# Patient Record
Sex: Female | Born: 1935 | Race: White | Hispanic: No | Marital: Single
Health system: Southern US, Community
[De-identification: ages and names within clinical notes are randomized; demographics above are authoritative.]

---

## 2015-02-23 DIAGNOSIS — Y93A9 Activity, other involving cardiorespiratory exercise: Secondary | ICD-10-CM

## 2015-02-23 NOTE — Patient Instructions (Signed)
Attended chair yoga class.

## 2015-02-23 NOTE — Congregational Nurse Program (Signed)
Congregational Nurse Program Note  Date of Encounter: 02/23/2015  Past Medical History: No past medical history on file.  Encounter Details:     CNP Questionnaire - 02/23/15 1601    Patient Demographics   Is this a new or existing patient? New   Patient is considered a/an Not Applicable   Race Caucasian/White   Patient Assistance   Location of Patient Assistance Not Applicable   Patient's financial/insurance status Medicare   Uninsured Patient No   Patient referred to apply for the following financial assistance Not Applicable   Food insecurities addressed Not Applicable   Transportation assistance No   Assistance securing medications No   Educational health offerings Exercise/physical activity   Encounter Details   Primary purpose of visit Education/Health Concerns   Was an Emergency Department visit averted? Not Applicable   Does patient have a medical provider? Yes   Patient referred to Not Applicable   Was a mental health screening completed? (GAINS tool) No   Does patient have dental issues? No   Since previous encounter, have you referred patient for abnormal blood pressure that resulted in a new diagnosis or medication change? No   Since previous encounter, have you referred patient for abnormal blood glucose that resulted in a new diagnosis or medication change? No   For Abstraction Use Only   Does patient have insurance? Yes

## 2015-04-22 DIAGNOSIS — Y9342 Activity, yoga: Secondary | ICD-10-CM

## 2015-04-22 NOTE — Congregational Nurse Program (Signed)
Congregational Nurse Program Note  Date of Encounter: 04/22/2015  Past Medical History: No past medical history on file.  Encounter Details:     CNP Questionnaire - 04/22/15 2204    For Abstraction Use Only   Does patient have insurance? Yes       Attended Chair Yoga Class.

## 2015-05-25 DIAGNOSIS — Y9342 Activity, yoga: Secondary | ICD-10-CM

## 2015-05-25 NOTE — Congregational Nurse Program (Unsigned)
Congregational Nurse Program Note  Date of Encounter: 05/25/2015  Past Medical History: No past medical history on file.  Encounter Details:     CNP Questionnaire - 05/25/15 0959    Patient Demographics   Is this a new or existing patient? Existing   Patient is considered a/an Not Applicable   Race Caucasian/White   Patient Assistance   Location of Patient Assistance Not Applicable   Patient's financial/insurance status Medicare   Uninsured Patient No   Patient referred to apply for the following financial assistance Not Applicable   Food insecurities addressed Not Applicable   Transportation assistance No   Assistance securing medications No   Educational health offerings Exercise/physical activity   Encounter Details   Primary purpose of visit Education/Health Concerns   Was an Emergency Department visit averted? Not Applicable   Does patient have a medical provider? Yes   Patient referred to Not Applicable   Was a mental health screening completed? (GAINS tool) No   Does patient have dental issues? No   Does patient have vision issues? No   Since previous encounter, have you referred patient for abnormal blood pressure that resulted in a new diagnosis or medication change? No   Since previous encounter, have you referred patient for abnormal blood glucose that resulted in a new diagnosis or medication change? No   For Abstraction Use Only   Does patient have insurance? Yes       Attended chair yoga class.

## 2015-06-23 DIAGNOSIS — H40013 Open angle with borderline findings, low risk, bilateral: Secondary | ICD-10-CM | POA: Diagnosis not present

## 2015-06-23 DIAGNOSIS — Q103 Other congenital malformations of eyelid: Secondary | ICD-10-CM | POA: Diagnosis not present

## 2015-06-23 DIAGNOSIS — H25013 Cortical age-related cataract, bilateral: Secondary | ICD-10-CM | POA: Diagnosis not present

## 2015-06-23 DIAGNOSIS — H2513 Age-related nuclear cataract, bilateral: Secondary | ICD-10-CM | POA: Diagnosis not present

## 2015-07-03 DIAGNOSIS — D1801 Hemangioma of skin and subcutaneous tissue: Secondary | ICD-10-CM | POA: Diagnosis not present

## 2015-07-03 DIAGNOSIS — L814 Other melanin hyperpigmentation: Secondary | ICD-10-CM | POA: Diagnosis not present

## 2015-07-03 DIAGNOSIS — D235 Other benign neoplasm of skin of trunk: Secondary | ICD-10-CM | POA: Diagnosis not present

## 2015-07-03 DIAGNOSIS — L570 Actinic keratosis: Secondary | ICD-10-CM | POA: Diagnosis not present

## 2015-07-03 DIAGNOSIS — L821 Other seborrheic keratosis: Secondary | ICD-10-CM | POA: Diagnosis not present

## 2015-07-28 DIAGNOSIS — Y9342 Activity, yoga: Secondary | ICD-10-CM

## 2015-07-28 NOTE — Congregational Nurse Program (Signed)
Congregational Nurse Program Note  Date of Encounter: 07/28/2015  Past Medical History: No past medical history on file.  Encounter Details:     CNP Questionnaire - 07/28/15 1433    Patient Demographics   Is this a new or existing patient? Existing   Patient is considered a/an Not Applicable   Race Caucasian/White   Patient Assistance   Location of Patient Assistance Not Applicable   Patient's financial/insurance status Medicare   Uninsured Patient No   Patient referred to apply for the following financial assistance Not Applicable   Food insecurities addressed Not Applicable   Transportation assistance No   Assistance securing medications No   Educational health offerings Exercise/physical activity   Encounter Details   Primary purpose of visit Education/Health Concerns   Was an Emergency Department visit averted? Not Applicable   Does patient have a medical provider? Yes   Patient referred to Not Applicable   Was a mental health screening completed? (GAINS tool) No   Does patient have dental issues? No   Does patient have vision issues? No   Does your patient have an abnormal blood pressure today? No   Since previous encounter, have you referred patient for abnormal blood pressure that resulted in a new diagnosis or medication change? No   Does your patient have an abnormal blood glucose today? No   Since previous encounter, have you referred patient for abnormal blood glucose that resulted in a new diagnosis or medication change? No   Was there a life-saving intervention made? No       Attended chair yoga class.

## 2015-10-01 DIAGNOSIS — Y9342 Activity, yoga: Secondary | ICD-10-CM

## 2015-10-01 NOTE — Congregational Nurse Program (Signed)
Congregational Nurse Program Note  Date of Encounter: 10/01/2015  Past Medical History: No past medical history on file.  Encounter Details:     CNP Questionnaire - 09/16/15 1047      Patient Demographics   Is this a new or existing patient? Existing   Patient is considered a/an Not Applicable   Race Caucasian/White     Patient Assistance   Location of Patient Assistance Not Applicable   Patient's financial/insurance status Medicare   Uninsured Patient No   Patient referred to apply for the following financial assistance Not Applicable   Food insecurities addressed Not Applicable   Transportation assistance No   Assistance securing medications No   Educational health offerings Exercise/physical activity     Encounter Details   Primary purpose of visit Education/Health Concerns   Was an Emergency Department visit averted? Not Applicable   Does patient have a medical provider? Yes   Patient referred to Not Applicable   Was a mental health screening completed? (GAINS tool) No   Does patient have dental issues? No   Does patient have vision issues? No   Does your patient have an abnormal blood pressure today? No   Since previous encounter, have you referred patient for abnormal blood pressure that resulted in a new diagnosis or medication change? No   Does your patient have an abnormal blood glucose today? No   Since previous encounter, have you referred patient for abnormal blood glucose that resulted in a new diagnosis or medication change? No   Was there a life-saving intervention made? No      Attended chair yoga class.

## 2015-10-02 DIAGNOSIS — Z0000 Encounter for general adult medical examination without abnormal findings: Secondary | ICD-10-CM | POA: Diagnosis not present

## 2015-10-02 DIAGNOSIS — Z131 Encounter for screening for diabetes mellitus: Secondary | ICD-10-CM | POA: Diagnosis not present

## 2015-11-12 DIAGNOSIS — Y9342 Activity, yoga: Secondary | ICD-10-CM

## 2015-11-12 NOTE — Congregational Nurse Program (Signed)
Attended chair yoga class.Congregational Nurse Program Note  Date of Encounter: 11/12/2015  Past Medical History: No past medical history on file.  Encounter Details:     CNP Questionnaire - 11/12/15 1837      Patient Demographics   Is this a new or existing patient? Existing   Patient is considered a/an Not Applicable   Race Caucasian/White     Patient Assistance   Location of Patient Assistance Not Applicable   Patient's financial/insurance status Medicare   Uninsured Patient No   Patient referred to apply for the following financial assistance Not Applicable   Food insecurities addressed Not Applicable   Transportation assistance No   Assistance securing medications No   Educational health offerings Exercise/physical activity     Encounter Details   Primary purpose of visit Education/Health Concerns   Was an Emergency Department visit averted? Not Applicable   Does patient have a medical provider? Yes   Patient referred to Not Applicable   Was a mental health screening completed? (GAINS tool) No   Does patient have dental issues? No   Does patient have vision issues? No   Does your patient have an abnormal blood pressure today? No   Since previous encounter, have you referred patient for abnormal blood pressure that resulted in a new diagnosis or medication change? No   Does your patient have an abnormal blood glucose today? No   Since previous encounter, have you referred patient for abnormal blood glucose that resulted in a new diagnosis or medication change? No   Was there a life-saving intervention made? No

## 2015-11-24 DIAGNOSIS — Y9342 Activity, yoga: Secondary | ICD-10-CM

## 2015-11-24 NOTE — Congregational Nurse Program (Signed)
Congregational Nurse Program Note  Date of Encounter: 11/24/2015  Past Medical History: No past medical history on file.  Encounter Details:     CNP Questionnaire - 11/24/15 1118      Patient Demographics   Is this a new or existing patient? Existing   Patient is considered a/an Not Applicable   Race Caucasian/White     Patient Assistance   Location of Patient Assistance Not Applicable   Patient's financial/insurance status Medicare   Uninsured Patient No   Patient referred to apply for the following financial assistance Not Applicable   Food insecurities addressed Not Applicable   Transportation assistance No   Assistance securing medications No   Educational health offerings Exercise/physical activity     Encounter Details   Primary purpose of visit Education/Health Concerns   Was an Emergency Department visit averted? Not Applicable   Does patient have a medical provider? Yes   Patient referred to Not Applicable   Was a mental health screening completed? (GAINS tool) No   Does patient have dental issues? No   Does patient have vision issues? No   Does your patient have an abnormal blood pressure today? No   Since previous encounter, have you referred patient for abnormal blood pressure that resulted in a new diagnosis or medication change? No   Does your patient have an abnormal blood glucose today? No   Since previous encounter, have you referred patient for abnormal blood glucose that resulted in a new diagnosis or medication change? No   Was there a life-saving intervention made? No    Attended chair yoga class.

## 2016-01-02 NOTE — Congregational Nurse Program (Signed)
Congregational Nurse Program Note  Date of Encounter: 01/02/2016  Past Medical History: No past medical history on file.  Encounter Details:     CNP Questionnaire - 01/02/16 1312      Patient Demographics   Is this a new or existing patient? Existing   Patient is considered a/an Not Applicable   Race Caucasian/White     Patient Assistance   Location of Patient Assistance Not Applicable   Patient's financial/insurance status Medicare   Uninsured Patient ([facility]) No   Patient referred to apply for the following financial assistance Not Applicable   Food insecurities addressed Not Applicable   Transportation assistance No   Assistance securing medications No   Educational health offerings Exercise/physical activity     Encounter Details   Primary purpose of visit Education/Health Concerns   Was an Emergency Department visit averted? Not Applicable   Does patient have a medical provider? Yes   Patient referred to Not Applicable   Was a mental health screening completed? (GAINS tool) No   Does patient have dental issues? No   Does patient have vision issues? No   Does your patient have an abnormal blood pressure today? No   Since previous encounter, have you referred patient for abnormal blood pressure that resulted in a new diagnosis or medication change? No   Does your patient have an abnormal blood glucose today? No   Since previous encounter, have you referred patient for abnormal blood glucose that resulted in a new diagnosis or medication change? No   Was there a life-saving intervention made? No     Attended chair yoga class.

## 2016-01-20 NOTE — Congregational Nurse Program (Signed)
Congregational Nurse Program Note  Date of Encounter: 01/20/2016  Past Medical History: No past medical history on file.  Encounter Details:     CNP Questionnaire - 01/20/16 1511      Patient Demographics   Is this a new or existing patient? Existing   Patient is considered a/an Not Applicable   Race Caucasian/White     Patient Assistance   Location of Patient Assistance Not Applicable   Patient's financial/insurance status Medicare   Uninsured Patient ([facility]) No   Patient referred to apply for the following financial assistance Not Applicable   Food insecurities addressed Not Applicable   Transportation assistance No   Assistance securing medications No   Educational health offerings Exercise/physical activity     Encounter Details   Primary purpose of visit Education/Health Concerns   Was an Emergency Department visit averted? Not Applicable   Does patient have a medical provider? Yes   Patient referred to Not Applicable   Was a mental health screening completed? (GAINS tool) No   Does patient have dental issues? No   Does patient have vision issues? No   Does your patient have an abnormal blood pressure today? No   Since previous encounter, have you referred patient for abnormal blood pressure that resulted in a new diagnosis or medication change? No   Does your patient have an abnormal blood glucose today? No   Since previous encounter, have you referred patient for abnormal blood glucose that resulted in a new diagnosis or medication change? No   Was there a life-saving intervention made? No     Attended chair yoga class.

## 2016-04-14 NOTE — Congregational Nurse Program (Signed)
Congregational Nurse Program Note  Date of Encounter: 04/14/2016  Past Medical History: No past medical history on file.  Encounter Details:     CNP Questionnaire - 04/14/16 1133      Patient Demographics   Is this a new or existing patient? Existing   Patient is considered a/an Not Applicable   Race Caucasian/White     Patient Assistance   Location of Patient Assistance Not Applicable   Patient's financial/insurance status Medicare   Uninsured Patient ([facility]) No   Patient referred to apply for the following financial assistance Not Applicable   Food insecurities addressed Not Applicable   Transportation assistance No   Assistance securing medications No   Educational health offerings Exercise/physical activity     Encounter Details   Primary purpose of visit Education/Health Concerns   Was an Emergency Department visit averted? Not Applicable   Does patient have a medical provider? Yes   Patient referred to Not Applicable   Was a mental health screening completed? (GAINS tool) No   Does patient have dental issues? No   Does patient have vision issues? No   Does your patient have an abnormal blood pressure today? No   Since previous encounter, have you referred patient for abnormal blood pressure that resulted in a new diagnosis or medication change? No   Does your patient have an abnormal blood glucose today? No   Since previous encounter, have you referred patient for abnormal blood glucose that resulted in a new diagnosis or medication change? No   Was there a life-saving intervention made? No    Attended chair yoga class.

## 2016-05-09 NOTE — Congregational Nurse Program (Signed)
Congregational Nurse Program Note  Date of Encounter: 05/09/2016  Past Medical History: No past medical history on file.  Encounter Details:     CNP Questionnaire - 05/09/16 1757      Patient Demographics   Is this a new or existing patient? Existing   Patient is considered a/an Not Applicable   Race Caucasian/White     Patient Assistance   Location of Patient Assistance Not Applicable   Patient's financial/insurance status Medicare   Uninsured Patient ([facility]) No   Patient referred to apply for the following financial assistance Not Applicable   Food insecurities addressed Not Applicable   Transportation assistance No   Assistance securing medications No   Educational health offerings Exercise/physical activity     Encounter Details   Primary purpose of visit Education/Health Concerns   Was an Emergency Department visit averted? Not Applicable   Does patient have a medical provider? Yes   Patient referred to Not Applicable   Was a mental health screening completed? (GAINS tool) No   Does patient have dental issues? No   Does patient have vision issues? No   Does your patient have an abnormal blood pressure today? No   Since previous encounter, have you referred patient for abnormal blood pressure that resulted in a new diagnosis or medication change? No   Does your patient have an abnormal blood glucose today? No   Since previous encounter, have you referred patient for abnormal blood glucose that resulted in a new diagnosis or medication change? No   Was there a life-saving intervention made? No     attended chair yoga class

## 2016-05-21 LAB — POCT LIPID PANEL
HDL: 54
LDL: 96
TC: 173
TRG: 114

## 2016-05-21 LAB — GLUCOSE, POCT (MANUAL RESULT ENTRY): POC Glucose: 89 mg/dl (ref 70–99)

## 2016-05-24 ENCOUNTER — Other Ambulatory Visit: Payer: Self-pay | Admitting: Family Medicine

## 2016-05-24 DIAGNOSIS — Z1231 Encounter for screening mammogram for malignant neoplasm of breast: Secondary | ICD-10-CM

## 2016-06-30 DIAGNOSIS — H25013 Cortical age-related cataract, bilateral: Secondary | ICD-10-CM | POA: Diagnosis not present

## 2016-06-30 DIAGNOSIS — H43813 Vitreous degeneration, bilateral: Secondary | ICD-10-CM | POA: Diagnosis not present

## 2016-06-30 DIAGNOSIS — H40013 Open angle with borderline findings, low risk, bilateral: Secondary | ICD-10-CM | POA: Diagnosis not present

## 2016-06-30 DIAGNOSIS — H2513 Age-related nuclear cataract, bilateral: Secondary | ICD-10-CM | POA: Diagnosis not present

## 2016-07-01 DIAGNOSIS — D225 Melanocytic nevi of trunk: Secondary | ICD-10-CM | POA: Diagnosis not present

## 2016-07-01 DIAGNOSIS — D1801 Hemangioma of skin and subcutaneous tissue: Secondary | ICD-10-CM | POA: Diagnosis not present

## 2016-07-01 DIAGNOSIS — L820 Inflamed seborrheic keratosis: Secondary | ICD-10-CM | POA: Diagnosis not present

## 2016-07-01 DIAGNOSIS — L814 Other melanin hyperpigmentation: Secondary | ICD-10-CM | POA: Diagnosis not present

## 2016-07-01 DIAGNOSIS — L570 Actinic keratosis: Secondary | ICD-10-CM | POA: Diagnosis not present

## 2016-07-14 ENCOUNTER — Telehealth: Payer: Self-pay | Admitting: Family Medicine

## 2016-07-14 NOTE — Telephone Encounter (Signed)
Patient calling to speak with you regarding the letter that was written for her regarding her weight loss please call her at [phone]

## 2016-07-19 ENCOUNTER — Ambulatory Visit
Admission: RE | Admit: 2016-07-19 | Discharge: 2016-07-19 | Disposition: A | Discharge status: Home / Self Care | Payer: PPO | Source: Ambulatory Visit | Attending: Family Medicine | Admitting: Family Medicine

## 2016-07-19 DIAGNOSIS — Z1231 Encounter for screening mammogram for malignant neoplasm of breast: Secondary | ICD-10-CM | POA: Diagnosis not present

## 2016-07-19 NOTE — Congregational Nurse Program (Signed)
Congregational Nurse Program Note  Date of Encounter: 07/19/2016  Past Medical History: No past medical history on file.  Encounter Details:    Attended chair yoga class.

## 2016-07-26 ENCOUNTER — Other Ambulatory Visit: Payer: Self-pay | Admitting: Family Medicine

## 2016-07-26 DIAGNOSIS — R928 Other abnormal and inconclusive findings on diagnostic imaging of breast: Secondary | ICD-10-CM

## 2016-08-02 ENCOUNTER — Other Ambulatory Visit: Payer: Self-pay | Admitting: Family Medicine

## 2016-08-02 ENCOUNTER — Ambulatory Visit
Admission: RE | Admit: 2016-08-02 | Discharge: 2016-08-02 | Disposition: A | Discharge status: Home / Self Care | Payer: PPO | Source: Ambulatory Visit | Attending: Family Medicine | Admitting: Family Medicine

## 2016-08-02 DIAGNOSIS — R928 Other abnormal and inconclusive findings on diagnostic imaging of breast: Secondary | ICD-10-CM

## 2016-08-02 DIAGNOSIS — N6310 Unspecified lump in the right breast, unspecified quadrant: Secondary | ICD-10-CM

## 2016-08-02 DIAGNOSIS — N6489 Other specified disorders of breast: Secondary | ICD-10-CM | POA: Diagnosis not present

## 2016-08-12 NOTE — Congregational Nurse Program (Signed)
Congregational Nurse Program Note  Date of Encounter: 08/12/2016  Past Medical History: No past medical history on file.  Encounter Details:     CNP Questionnaire - 08/12/16 1409      Patient Demographics   Is this a new or existing patient? Existing   Patient is considered a/an Not Applicable   Race Caucasian/White     Patient Assistance   Location of Patient Assistance Not Applicable   Patient's financial/insurance status Medicare   Uninsured Patient ([facility]) No   Patient referred to apply for the following financial assistance Not Applicable   Food insecurities addressed Not Applicable   Transportation assistance No   Assistance securing medications No   Educational health offerings Exercise/physical activity     Encounter Details   Primary purpose of visit Education/Health Concerns   Was an Emergency Department visit averted? Not Applicable   Does patient have a medical provider? Yes   Patient referred to Not Applicable   Was a mental health screening completed? (GAINS tool) No   Does patient have dental issues? No   Does patient have vision issues? No   Does your patient have an abnormal blood pressure today? No   Since previous encounter, have you referred patient for abnormal blood pressure that resulted in a new diagnosis or medication change? No   Does your patient have an abnormal blood glucose today? No   Since previous encounter, have you referred patient for abnormal blood glucose that resulted in a new diagnosis or medication change? No   Was there a life-saving intervention made? No    Attended chair yoga class.

## 2016-10-18 NOTE — Congregational Nurse Program (Signed)
Congregational Nurse Program Note  Date of Encounter: 10/18/2016  Past Medical History: No past medical history on file.  Encounter Details:     CNP Questionnaire - 10/18/16 1537      Patient Demographics   Is this a new or existing patient? Existing   Patient is considered a/an Not Applicable   Race Caucasian/White     Patient Assistance   Location of Patient Assistance Not Applicable   Patient's financial/insurance status Medicare   Uninsured Patient ([facility]) No   Patient referred to apply for the following financial assistance Not Applicable   Food insecurities addressed Not Applicable   Transportation assistance No   Assistance securing medications No   Educational health offerings Exercise/physical activity     Encounter Details   Primary purpose of visit Education/Health Concerns   Was an Emergency Department visit averted? Not Applicable   Does patient have a medical provider? Yes   Patient referred to Not Applicable   Was a mental health screening completed? (GAINS tool) No   Does patient have dental issues? No   Does patient have vision issues? No   Does your patient have an abnormal blood pressure today? No   Since previous encounter, have you referred patient for abnormal blood pressure that resulted in a new diagnosis or medication change? No   Does your patient have an abnormal blood glucose today? No   Since previous encounter, have you referred patient for abnormal blood glucose that resulted in a new diagnosis or medication change? No   Was there a life-saving intervention made? No    Attended chair yoga class.

## 2016-10-26 DIAGNOSIS — Z131 Encounter for screening for diabetes mellitus: Secondary | ICD-10-CM | POA: Diagnosis not present

## 2016-10-26 DIAGNOSIS — Z0000 Encounter for general adult medical examination without abnormal findings: Secondary | ICD-10-CM | POA: Diagnosis not present

## 2016-10-26 DIAGNOSIS — Z1389 Encounter for screening for other disorder: Secondary | ICD-10-CM | POA: Diagnosis not present

## 2016-10-26 DIAGNOSIS — N6009 Solitary cyst of unspecified breast: Secondary | ICD-10-CM | POA: Diagnosis not present

## 2016-10-26 DIAGNOSIS — E7800 Pure hypercholesterolemia, unspecified: Secondary | ICD-10-CM | POA: Diagnosis not present

## 2016-11-10 NOTE — Congregational Nurse Program (Signed)
Congregational Nurse Program Note  Date of Encounter: 11/10/2016  Past Medical History: No past medical history on file.  Encounter Details:

## 2016-11-10 NOTE — Congregational Nurse Program (Signed)
Congregational Nurse Program Note  Date of Encounter: 11/10/2016  Past Medical History: No past medical history on file.  Encounter Details: Attended chair yoga class.

## 2016-12-20 NOTE — Congregational Nurse Program (Signed)
Congregational Nurse Program Note  Date of Encounter: 12/20/2016  Past Medical History: No past medical history on file.  Encounter Details: CNP Questionnaire - 12/20/16 1452      Questionnaire   Patient Status  Not Applicable    Race  White or Caucasian    Location Patient Served At  Not Applicable    Insurance  Medicare    Uninsured  Not Applicable    Food  No food insecurities    Housing/Utilities  Yes, have permanent housing    Transportation  No transportation needs    Interpersonal Safety  Yes, feel physically and emotionally safe where you currently live    Medication  No medication insecurities    Medical Provider  Yes    Referrals  Not Applicable    ED Visit Averted  Not Applicable    Life-Saving Intervention Made  Not Applicable       Madelaine Etienne, [place], [phone].

## 2017-01-13 NOTE — Congregational Nurse Program (Signed)
Congregational Nurse Program Note  Date of Encounter: 01/13/2017  Past Medical History: No past medical history on file.  Encounter Details: CNP Questionnaire - 01/13/17 2251      Questionnaire   Patient Status  Not Applicable    Race  White or Caucasian    Location Patient Served At  Not Applicable    Insurance  Medicare    Uninsured  Not Applicable    Food  No food insecurities    Housing/Utilities  Yes, have permanent housing    Transportation  No transportation needs    Interpersonal Safety  Yes, feel physically and emotionally safe where you currently live    Medication  No medication insecurities    Medical Provider  Yes    Referrals  Not Applicable    ED Visit Averted  Not Applicable    Life-Saving Intervention Made  Not Applicable     CNP, Madelaine Etienne, [phone].

## 2017-02-02 ENCOUNTER — Ambulatory Visit
Admission: RE | Admit: 2017-02-02 | Discharge: 2017-02-02 | Disposition: A | Discharge status: Home / Self Care | Payer: PPO | Source: Ambulatory Visit | Attending: Family Medicine | Admitting: Family Medicine

## 2017-02-02 DIAGNOSIS — N6310 Unspecified lump in the right breast, unspecified quadrant: Secondary | ICD-10-CM | POA: Diagnosis not present

## 2017-02-02 DIAGNOSIS — R928 Other abnormal and inconclusive findings on diagnostic imaging of breast: Secondary | ICD-10-CM | POA: Diagnosis not present

## 2017-05-30 ENCOUNTER — Ambulatory Visit (HOSPITAL_COMMUNITY)
Admission: EM | Admit: 2017-05-30 | Discharge: 2017-05-30 | Disposition: A | Discharge status: Home / Self Care | Payer: PPO | Attending: Family Medicine | Admitting: Family Medicine

## 2017-05-30 ENCOUNTER — Encounter (HOSPITAL_COMMUNITY): Payer: Self-pay | Admitting: Emergency Medicine

## 2017-05-30 DIAGNOSIS — R05 Cough: Secondary | ICD-10-CM | POA: Diagnosis not present

## 2017-05-30 DIAGNOSIS — R6883 Chills (without fever): Secondary | ICD-10-CM

## 2017-05-30 DIAGNOSIS — R059 Cough, unspecified: Secondary | ICD-10-CM

## 2017-05-30 MED ORDER — BENZONATATE 100 MG PO CAPS: 100.0000 mg | ORAL_CAPSULE | Freq: Three times a day (TID) | ORAL | 0 refills | Status: DC

## 2017-05-30 MED ORDER — CETIRIZINE HCL 5 MG PO TABS: 5.0000 mg | ORAL_TABLET | Freq: Every day | ORAL | 0 refills | Status: DC

## 2017-05-30 MED ORDER — IPRATROPIUM BROMIDE 0.06 % NA SOLN: 2.0000 | Freq: Four times a day (QID) | NASAL | 0 refills | Status: DC

## 2017-05-30 NOTE — Discharge Instructions (Signed)
Tessalon for cough. Start Atrovent nasal spray, zyrtec for nasal congestion/drainage. You can use over the counter nasal saline rinse such as neti pot for nasal congestion. Keep hydrated, your urine should be clear to pale yellow in color. Tylenol/motrin for fever and pain. Monitor for any worsening of symptoms, chest pain, shortness of breath, wheezing, swelling of the throat, fever >100.5, weakness, dizziness, syncope, go to the emergency department for further evaluation needed.  For sore throat try using a honey-based tea. Use 3 teaspoons of honey with juice squeezed from half lemon. Place shaved pieces of ginger into 1/2-1 cup of water and warm over stove top. Then mix the ingredients and repeat every 4 hours as needed.

## 2017-05-30 NOTE — ED Triage Notes (Signed)
Pt c/o having chills starting today. Also c/o cough and nasal congestion

## 2017-05-30 NOTE — ED Provider Notes (Signed)
[place]    CSN: [id number] Arrival date & time: 05/30/17  1610     History   Chief Complaint Chief Complaint  Patient presents with  . Chills    HPI April Dixon is a 84 y.o. female.   84 year old female comes in for 1 day history of URI symptoms.  Has had cough, nasal congestion, chills, sore throat from coughing, sneezing.  Denies fever, night sweats.  Denies urinary symptoms such as frequency, dysuria, hematuria.  Denies chest pain, shortness of breath, wheezing.  Denies weakness, dizziness, syncope.  Took Mucinex without relief.  So been eating and drinking without problems. Never smoker.  She does not take any medication on a daily basis. Denies history of heart disease, HTN, DM.     History reviewed. No pertinent past medical history.  There are no active problems to display for this patient.   History reviewed. No pertinent surgical history.  OB History   None      Home Medications    Prior to Admission medications   Medication Sig Start Date End Date Taking? Authorizing Provider  benzonatate (TESSALON) 100 MG capsule Take 1 capsule (100 mg total) by mouth every 8 (eight) hours. 05/30/17   Tasia Catchings, Kiara Mcdowell V, PA-C  cetirizine (ZYRTEC) 5 MG tablet Take 1 tablet (5 mg total) by mouth daily. 05/30/17   Tasia Catchings, Teasha Murrillo V, PA-C  ipratropium (ATROVENT) 0.06 % nasal spray Place 2 sprays into both nostrils 4 (four) times daily. 05/30/17   Ok Edwards, PA-C    Family History No family history on file.  Social History Social History   Tobacco Use  . Smoking status: Not on file  Substance Use Topics  . Alcohol use: Not on file  . Drug use: Not on file     Allergies   Patient has no known allergies.   Review of Systems Review of Systems  Reason unable to perform ROS: See HPI as above.     Physical Exam Triage Vital Signs ED Triage Vitals  Enc Vitals Group     BP 05/30/17 1618 137/62     Pulse Rate 05/30/17 1618 97     Resp 05/30/17 1618 18     Temp  05/30/17 1618 98.4 F (36.9 C)     Temp src --      SpO2 05/30/17 1618 100 %     Weight --      Height --      Head Circumference --      Peak Flow --      Pain Score 05/30/17 1619 0     Pain Loc --      Pain Edu? --      Excl. in [place]? --    No data found.  Updated Vital Signs BP 137/62   Pulse 97   Temp 98.4 F (36.9 C)   Resp 18   SpO2 100%    Physical Exam  Constitutional: She is oriented to person, place, and time. She appears well-developed and well-nourished. No distress.  HENT:  Head: Normocephalic and atraumatic.  Right Ear: Tympanic membrane, external ear and ear canal normal. Tympanic membrane is not erythematous and not bulging.  Left Ear: Tympanic membrane, external ear and ear canal normal. Tympanic membrane is not erythematous and not bulging.  Nose: Rhinorrhea present. Right sinus exhibits no maxillary sinus tenderness and no frontal sinus tenderness. Left sinus exhibits no maxillary sinus tenderness and no frontal sinus tenderness.  Mouth/Throat: Uvula is midline,  oropharynx is clear and moist and mucous membranes are normal.  Eyes: Pupils are equal, round, and reactive to light. Conjunctivae are normal.  Neck: Normal range of motion. Neck supple.  Cardiovascular: Normal rate, regular rhythm and normal heart sounds. Exam reveals no gallop and no friction rub.  No murmur heard. Pulmonary/Chest: Effort normal and breath sounds normal. She has no decreased breath sounds. She has no wheezes. She has no rhonchi. She has no rales.  Lymphadenopathy:    She has no cervical adenopathy.  Neurological: She is alert and oriented to person, place, and time.  Skin: Skin is warm and dry.  Psychiatric: She has a normal mood and affect. Her behavior is normal. Judgment normal.     UC Treatments / Results  Labs (all labs ordered are listed, but only abnormal results are displayed) Labs Reviewed - No data to display  EKG None Radiology No results  found.  Procedures Procedures (including critical care time)  Medications Ordered in UC Medications - No data to display   Initial Impression / Assessment and Plan / UC Course  I have reviewed the triage vital signs and the nursing notes.  Pertinent labs & imaging results that were available during my care of the patient were reviewed by me and considered in my medical decision making (see chart for details).    84 year old female comes in for 1 day history of URI symptoms.  She is afebrile, without tachycardia, tachypnea, O2 saturation at 100%, in no acute distress. Will treat for viral illness for now. Push fluids. Return precautions given. Patient expresses understanding and agrees to plan.   Final Clinical Impressions(s) / UC Diagnoses   Final diagnoses:  Cough  Chills    ED Discharge Orders        Ordered    benzonatate (TESSALON) 100 MG capsule  Every 8 hours     05/30/17 1633    ipratropium (ATROVENT) 0.06 % nasal spray  4 times daily     05/30/17 1633    cetirizine (ZYRTEC) 5 MG tablet  Daily     05/30/17 1633        Ok Edwards, PA-C 05/30/17 1640

## 2017-06-22 DIAGNOSIS — H35033 Hypertensive retinopathy, bilateral: Secondary | ICD-10-CM | POA: Diagnosis not present

## 2017-06-22 DIAGNOSIS — H2513 Age-related nuclear cataract, bilateral: Secondary | ICD-10-CM | POA: Diagnosis not present

## 2017-06-22 DIAGNOSIS — H25013 Cortical age-related cataract, bilateral: Secondary | ICD-10-CM | POA: Diagnosis not present

## 2017-06-22 DIAGNOSIS — H40013 Open angle with borderline findings, low risk, bilateral: Secondary | ICD-10-CM | POA: Diagnosis not present

## 2017-07-26 DIAGNOSIS — R609 Edema, unspecified: Secondary | ICD-10-CM | POA: Diagnosis not present

## 2017-07-27 DIAGNOSIS — L821 Other seborrheic keratosis: Secondary | ICD-10-CM | POA: Diagnosis not present

## 2017-07-27 DIAGNOSIS — L814 Other melanin hyperpigmentation: Secondary | ICD-10-CM | POA: Diagnosis not present

## 2017-07-27 DIAGNOSIS — D225 Melanocytic nevi of trunk: Secondary | ICD-10-CM | POA: Diagnosis not present

## 2017-07-27 DIAGNOSIS — D1801 Hemangioma of skin and subcutaneous tissue: Secondary | ICD-10-CM | POA: Diagnosis not present

## 2017-07-27 DIAGNOSIS — L308 Other specified dermatitis: Secondary | ICD-10-CM | POA: Diagnosis not present

## 2017-07-27 DIAGNOSIS — L570 Actinic keratosis: Secondary | ICD-10-CM | POA: Diagnosis not present

## 2017-09-04 ENCOUNTER — Other Ambulatory Visit: Payer: Self-pay | Admitting: Family Medicine

## 2017-09-04 DIAGNOSIS — N6310 Unspecified lump in the right breast, unspecified quadrant: Secondary | ICD-10-CM

## 2017-09-08 ENCOUNTER — Other Ambulatory Visit: Payer: Self-pay | Admitting: Family Medicine

## 2017-09-08 ENCOUNTER — Ambulatory Visit
Admission: RE | Admit: 2017-09-08 | Discharge: 2017-09-08 | Disposition: A | Discharge status: Home / Self Care | Payer: PPO | Source: Ambulatory Visit | Attending: Family Medicine | Admitting: Family Medicine

## 2017-09-08 DIAGNOSIS — N6001 Solitary cyst of right breast: Secondary | ICD-10-CM | POA: Diagnosis not present

## 2017-09-08 DIAGNOSIS — N6310 Unspecified lump in the right breast, unspecified quadrant: Secondary | ICD-10-CM

## 2017-09-08 DIAGNOSIS — R928 Other abnormal and inconclusive findings on diagnostic imaging of breast: Secondary | ICD-10-CM | POA: Diagnosis not present

## 2017-09-12 ENCOUNTER — Ambulatory Visit
Admission: RE | Admit: 2017-09-12 | Discharge: 2017-09-12 | Disposition: A | Discharge status: Home / Self Care | Payer: PPO | Source: Ambulatory Visit | Attending: Family Medicine | Admitting: Family Medicine

## 2017-09-12 ENCOUNTER — Other Ambulatory Visit: Payer: Self-pay | Admitting: Family Medicine

## 2017-09-12 DIAGNOSIS — N6310 Unspecified lump in the right breast, unspecified quadrant: Secondary | ICD-10-CM

## 2017-09-12 DIAGNOSIS — N6341 Unspecified lump in right breast, subareolar: Secondary | ICD-10-CM | POA: Diagnosis not present

## 2017-09-12 DIAGNOSIS — D241 Benign neoplasm of right breast: Secondary | ICD-10-CM | POA: Diagnosis not present

## 2017-09-29 DIAGNOSIS — D241 Benign neoplasm of right breast: Secondary | ICD-10-CM | POA: Diagnosis not present

## 2017-10-02 ENCOUNTER — Other Ambulatory Visit: Payer: Self-pay | Admitting: General Surgery

## 2017-10-02 DIAGNOSIS — D241 Benign neoplasm of right breast: Secondary | ICD-10-CM

## 2017-11-16 DIAGNOSIS — Z23 Encounter for immunization: Secondary | ICD-10-CM | POA: Diagnosis not present

## 2017-11-16 DIAGNOSIS — Z862 Personal history of diseases of the blood and blood-forming organs and certain disorders involving the immune mechanism: Secondary | ICD-10-CM | POA: Diagnosis not present

## 2017-11-16 DIAGNOSIS — D369 Benign neoplasm, unspecified site: Secondary | ICD-10-CM | POA: Diagnosis not present

## 2017-11-16 DIAGNOSIS — H35033 Hypertensive retinopathy, bilateral: Secondary | ICD-10-CM | POA: Diagnosis not present

## 2017-11-16 DIAGNOSIS — E7800 Pure hypercholesterolemia, unspecified: Secondary | ICD-10-CM | POA: Diagnosis not present

## 2017-11-16 DIAGNOSIS — Z0000 Encounter for general adult medical examination without abnormal findings: Secondary | ICD-10-CM | POA: Diagnosis not present

## 2017-11-16 DIAGNOSIS — Z1389 Encounter for screening for other disorder: Secondary | ICD-10-CM | POA: Diagnosis not present

## 2017-11-22 DIAGNOSIS — M810 Age-related osteoporosis without current pathological fracture: Secondary | ICD-10-CM | POA: Diagnosis not present

## 2017-11-22 DIAGNOSIS — M8588 Other specified disorders of bone density and structure, other site: Secondary | ICD-10-CM | POA: Diagnosis not present

## 2017-11-22 DIAGNOSIS — L509 Urticaria, unspecified: Secondary | ICD-10-CM | POA: Diagnosis not present

## 2017-11-24 DIAGNOSIS — R260 Ataxic gait: Secondary | ICD-10-CM | POA: Diagnosis not present

## 2017-11-28 ENCOUNTER — Encounter: Payer: Self-pay | Admitting: Podiatry

## 2017-11-28 ENCOUNTER — Ambulatory Visit: Payer: PPO | Admitting: Podiatry

## 2017-11-28 DIAGNOSIS — B351 Tinea unguium: Secondary | ICD-10-CM | POA: Diagnosis not present

## 2017-11-28 NOTE — Progress Notes (Signed)
   Subjective:    Patient ID: April Dixon, female    DOB: 08-26-1931, 84 y.o.   MRN: [id number]  HPI    Review of Systems     Objective:   Physical Exam        Assessment & Plan:

## 2017-12-06 NOTE — Progress Notes (Signed)
Subjective:   Patient ID: April Dixon, female   DOB: 84 y.o.   MRN: [id number]   HPI Patient presents with thickness of the nailbeds 1-5 both feet and states she cannot cut the nails herself currently.  Patient does not smoke likes to be active but has trouble with these nails   Review of Systems  All other systems reviewed and are negative.       Objective:  Physical Exam  Constitutional: She appears well-developed and well-nourished.  Cardiovascular: Intact distal pulses.  Pulmonary/Chest: Effort normal.  Musculoskeletal: Normal range of motion.  Neurological: She is alert.  Skin: Skin is warm.  Nursing note and vitals reviewed.   Neurovascular status was found to be intact with patient found to have thick tight nailbeds 1-5 both feet that are impossible for her to cut and can become bothersome if she is in tight shoe gear.  Patient has good digital perfusion well oriented x3     Assessment:  Mycotic nail infection 1-5 both feet with lady who cannot take care of them herself     Plan:  H&P conditions reviewed debridement accomplished and advised on routine care and encouraged to call us with any questions come in earlier if any issues should occur

## 2017-12-07 DIAGNOSIS — R260 Ataxic gait: Secondary | ICD-10-CM | POA: Diagnosis not present

## 2017-12-28 DIAGNOSIS — R260 Ataxic gait: Secondary | ICD-10-CM | POA: Diagnosis not present

## 2018-01-25 DIAGNOSIS — R260 Ataxic gait: Secondary | ICD-10-CM | POA: Diagnosis not present

## 2018-02-22 ENCOUNTER — Other Ambulatory Visit: Payer: Self-pay | Admitting: General Surgery

## 2018-02-22 ENCOUNTER — Ambulatory Visit
Admission: RE | Admit: 2018-02-22 | Discharge: 2018-02-22 | Disposition: A | Discharge status: Home / Self Care | Payer: PPO | Source: Ambulatory Visit | Attending: General Surgery | Admitting: General Surgery

## 2018-02-22 ENCOUNTER — Ambulatory Visit: Payer: PPO

## 2018-02-22 DIAGNOSIS — D241 Benign neoplasm of right breast: Secondary | ICD-10-CM

## 2018-02-22 DIAGNOSIS — R928 Other abnormal and inconclusive findings on diagnostic imaging of breast: Secondary | ICD-10-CM | POA: Diagnosis not present

## 2018-03-01 ENCOUNTER — Ambulatory Visit: Payer: PPO | Admitting: Podiatry

## 2018-03-01 DIAGNOSIS — R260 Ataxic gait: Secondary | ICD-10-CM | POA: Diagnosis not present

## 2018-04-04 IMAGING — MG 2D DIGITAL DIAGNOSTIC UNILATERAL RIGHT MAMMOGRAM WITH CAD AND AD
6 series · 6 of 14 positions shown · non-contrast
Comparison: Previous exam(s).

CLINICAL DATA: Screening recall for possible mass in the right
breast.

EXAM:
2D DIGITAL DIAGNOSTIC RIGHT MAMMOGRAM WITH CAD AND ADJUNCT TOMO
ULTRASOUND RIGHT BREAST

[R MLO synth-2D]
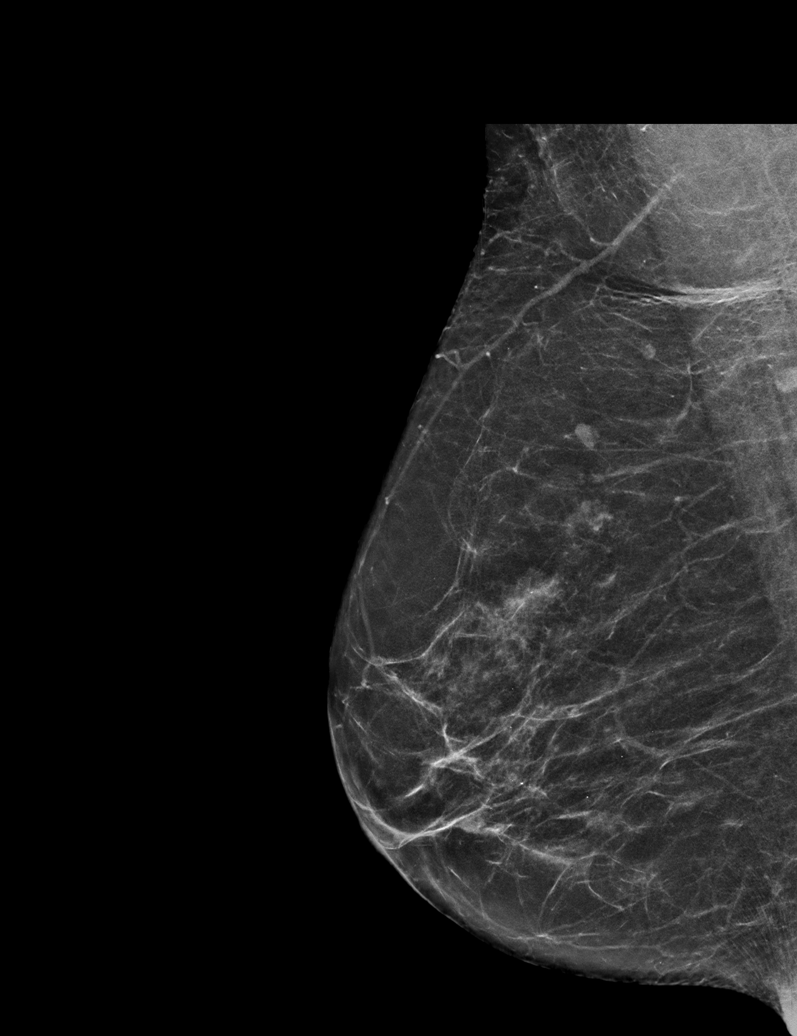

[R CC]
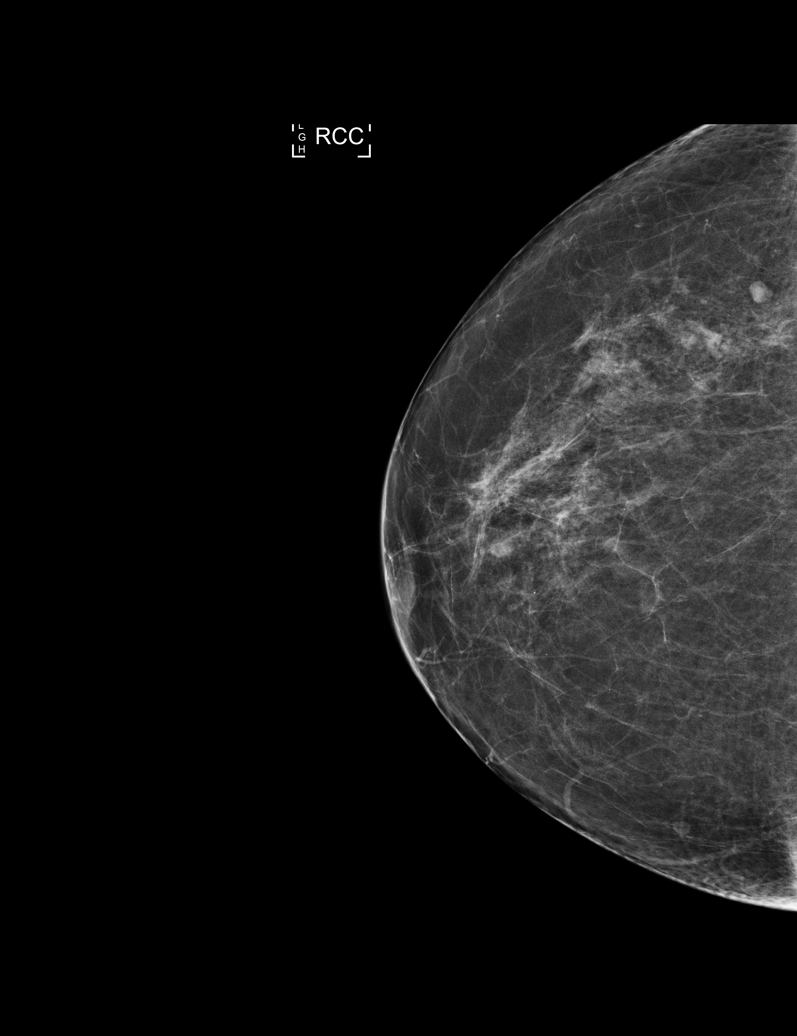

[R CC synth-2D]
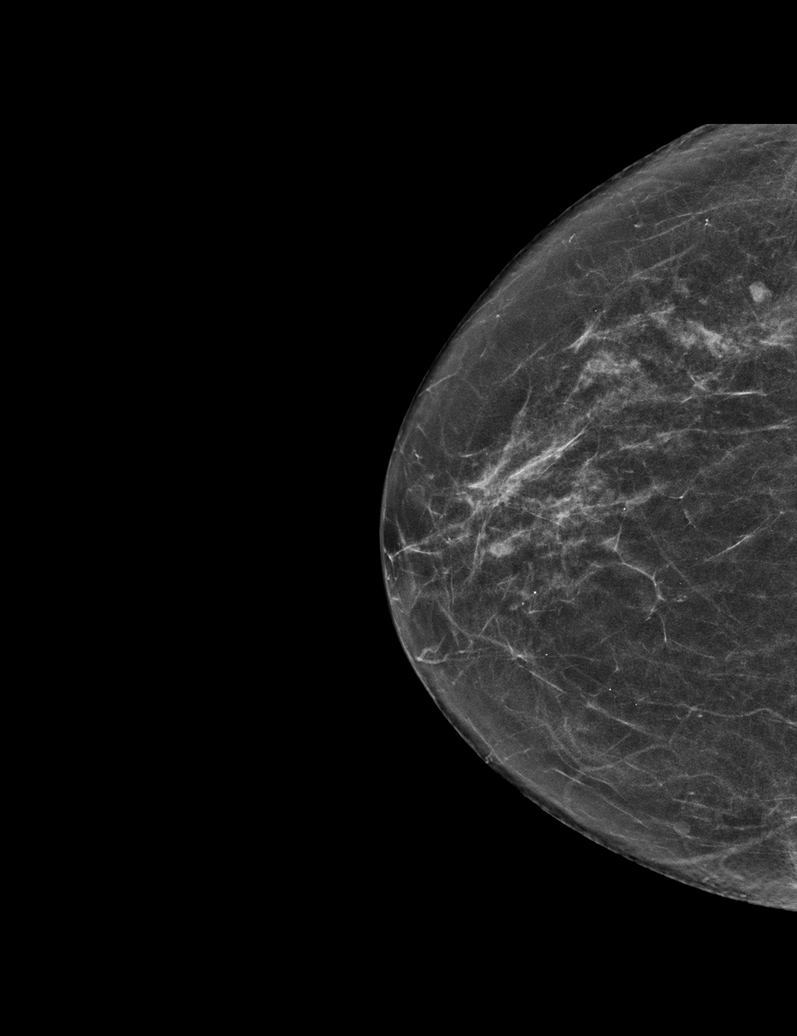

[R MLO]
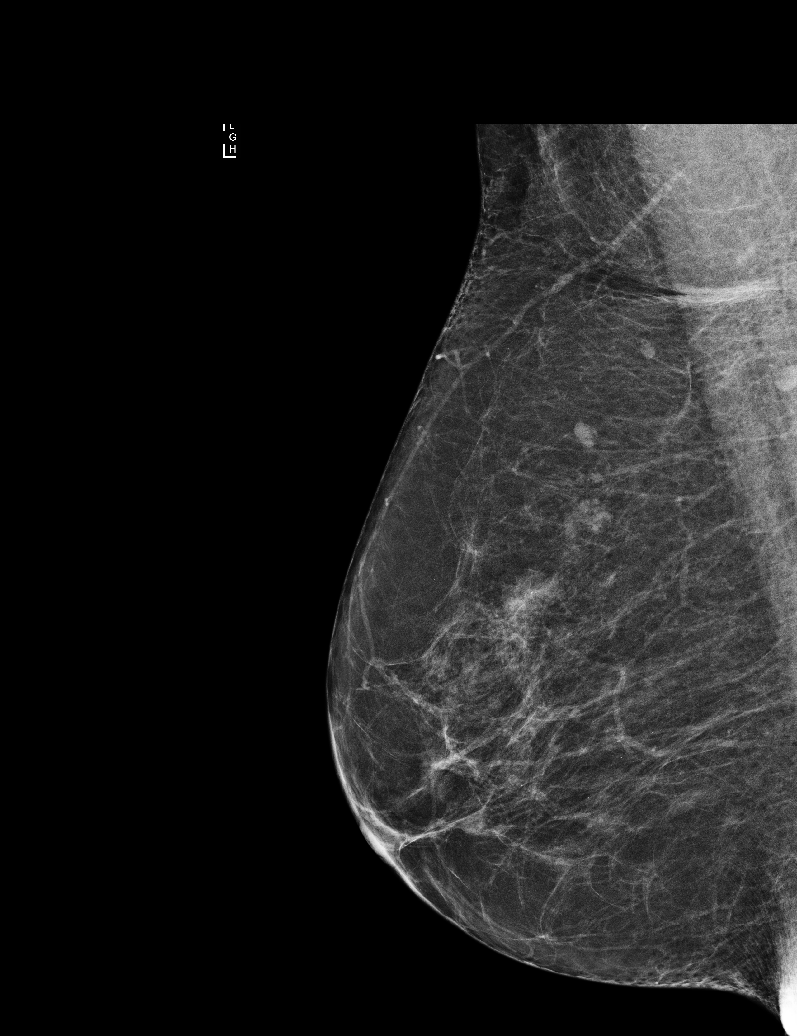

[R CC tomo · tomo slice 29/57.0]
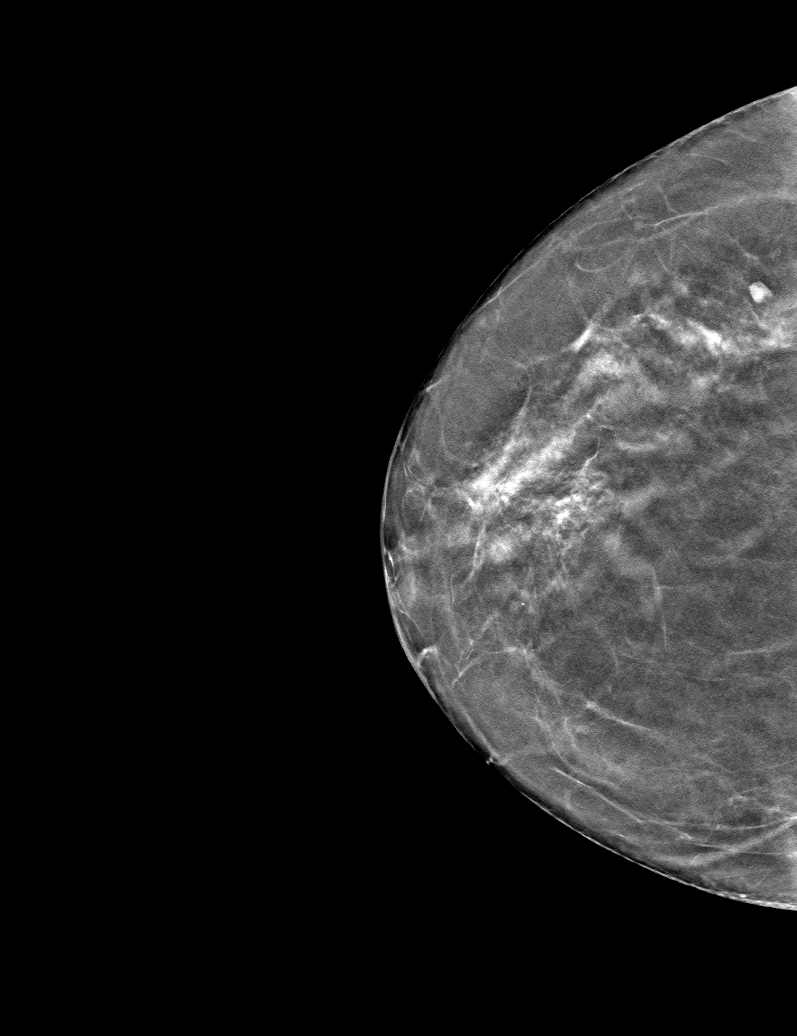

[R MLO tomo · tomo slice 31/61.0]
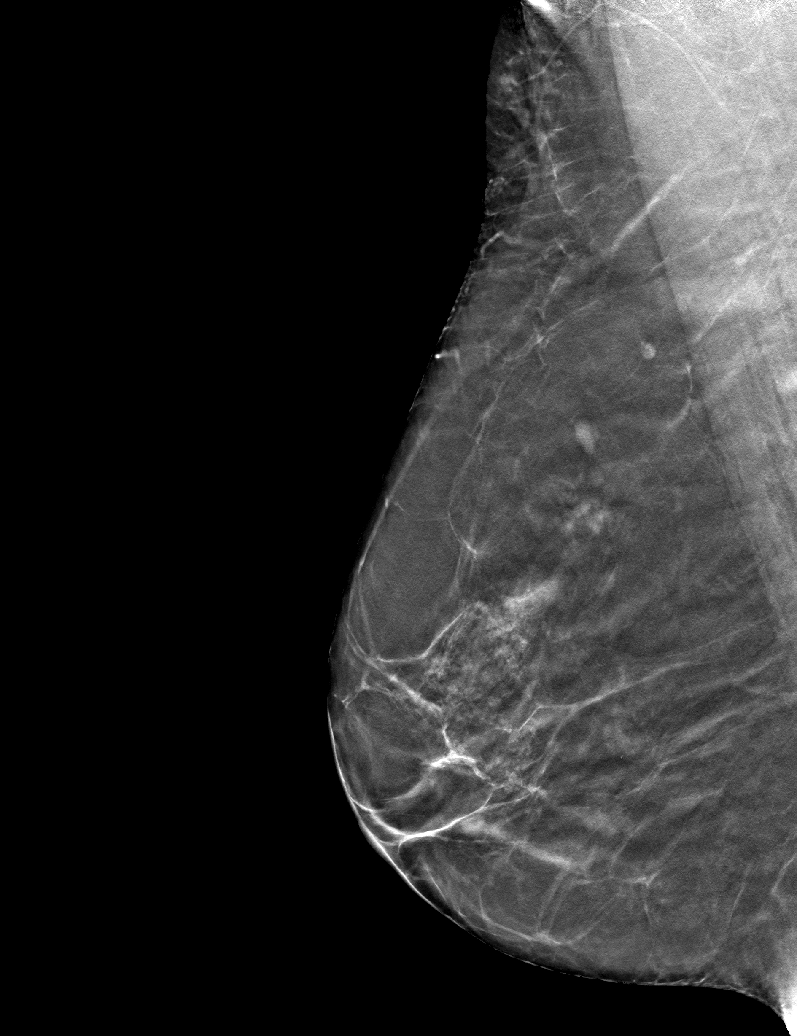

[6 of 14 positions shown; findings below may reference images not displayed]

ACR Breast Density Category b: There are scattered areas of
fibroglandular density.
FINDINGS: Under diagnostic imaging, the possible mass persists. It is oval and
circumscribed in the retroareolar, slightly inferior aspect of the
right breast. It measures 5-6 mm in size.

Mammographic images were processed with CAD.

On physical exam, no mass is palpated in the retroareolar right
breast.

Targeted ultrasound is performed, showing an oval circumscribed hypo
to anechoic mass in the retroareolar right breast at 9 o'clock,
measuring 5 x 4 x 4 mm. There are internal echoes consistent with
septa and some debris, but no convincing internal blood flow. This
is most likely a complicated cyst.
IMPRESSION: 1. Probably benign small right breast mass, most likely a
complicated cyst. Short-term follow-up recommended.

RECOMMENDATION:
1. Diagnostic right breast mammography and ultrasound in 6 months.

I have discussed the findings and recommendations with the patient.
Results were also provided in writing at the conclusion of the
visit. If applicable, a reminder letter will be sent to the patient
regarding the next appointment.

BI-RADS CATEGORY  3: Probably benign.

## 2018-04-04 IMAGING — US ULTRASOUND RIGHT BREAST LIMITED
1 series · 9 of 9 positions shown · non-contrast
Comparison: Previous exam(s).

CLINICAL DATA: Screening recall for possible mass in the right
breast.

EXAM:
2D DIGITAL DIAGNOSTIC RIGHT MAMMOGRAM WITH CAD AND ADJUNCT TOMO
ULTRASOUND RIGHT BREAST

[Series 1: ultrasound right breast limited · 0.06mm/px · 9 of 9 slices shown]
[im 1/9]
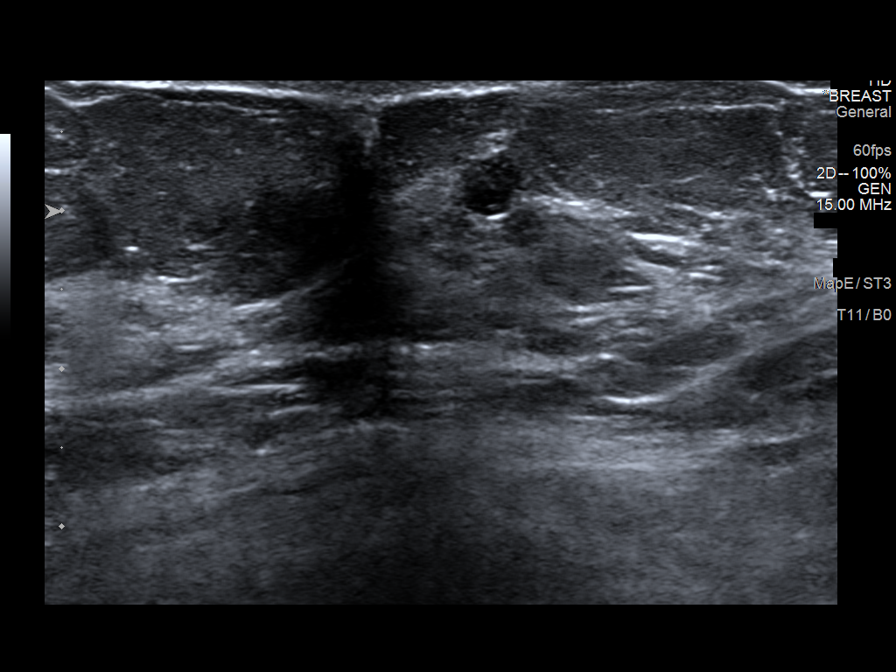
[im 2/9]
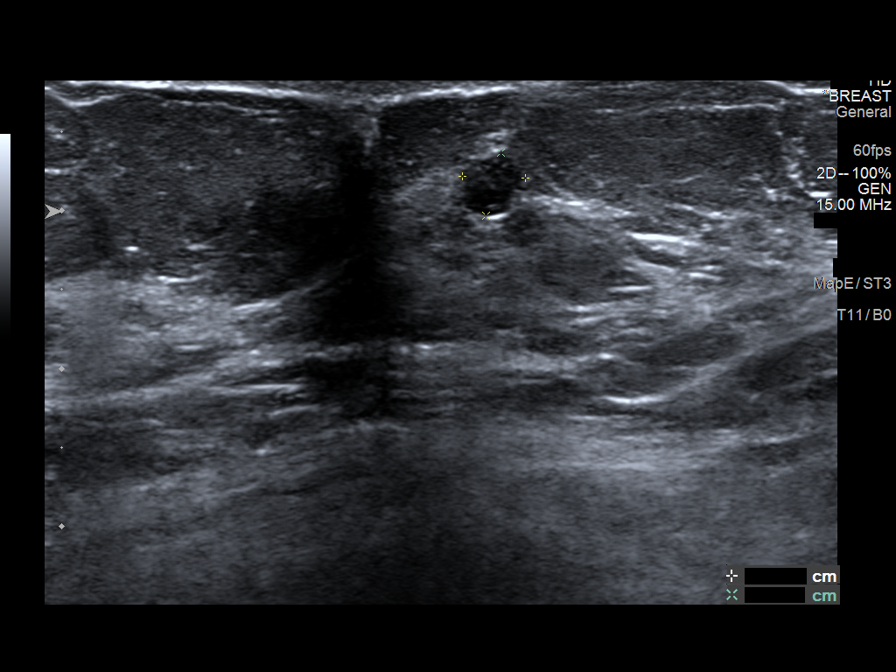
[im 3/9]
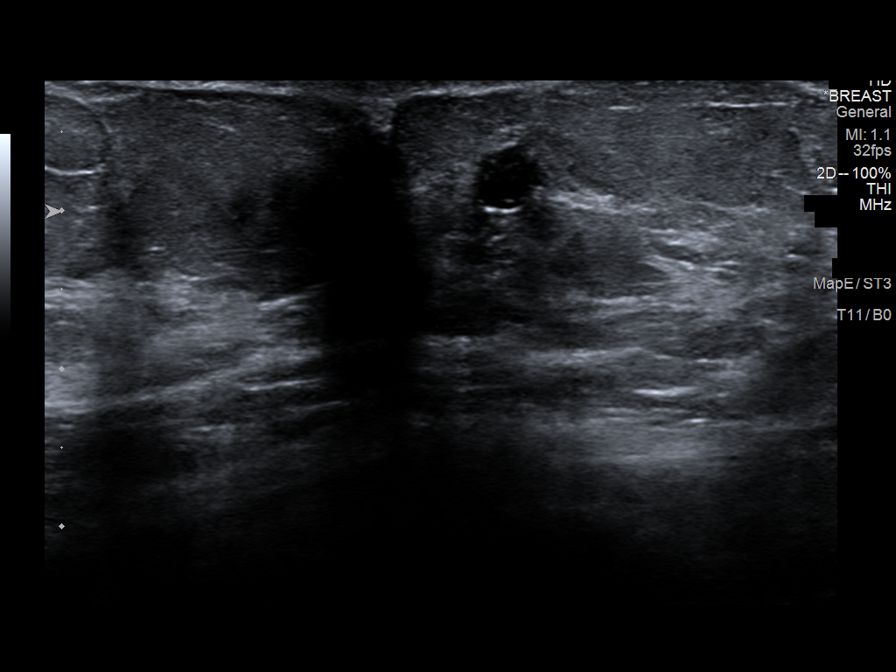
[im 4/9]
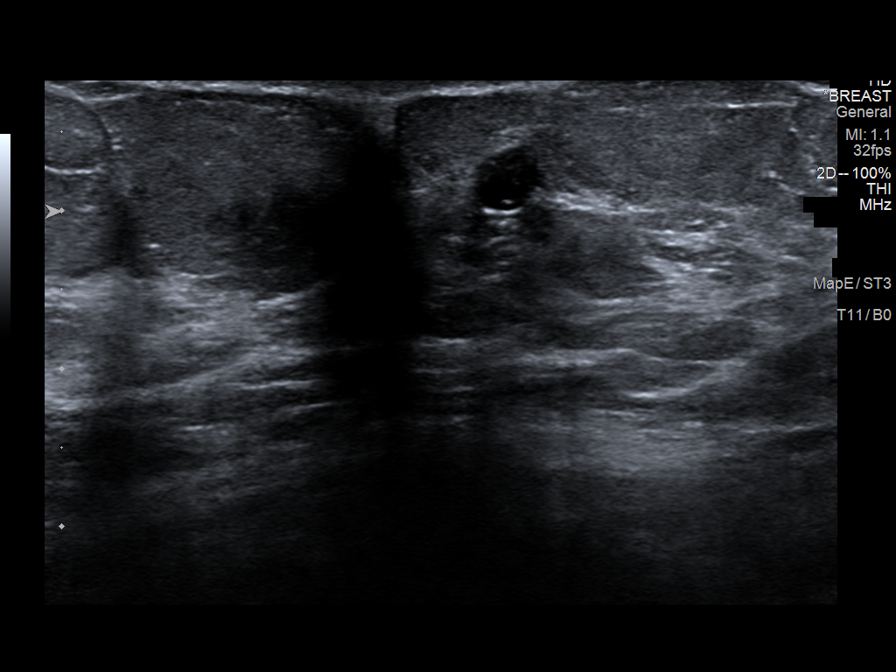
[im 5/9]
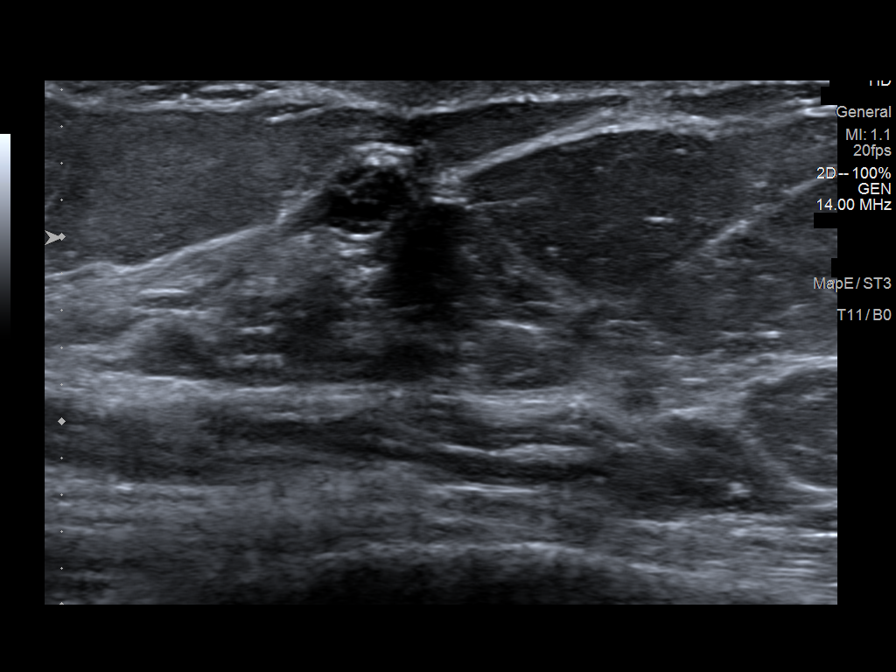
[im 6/9]
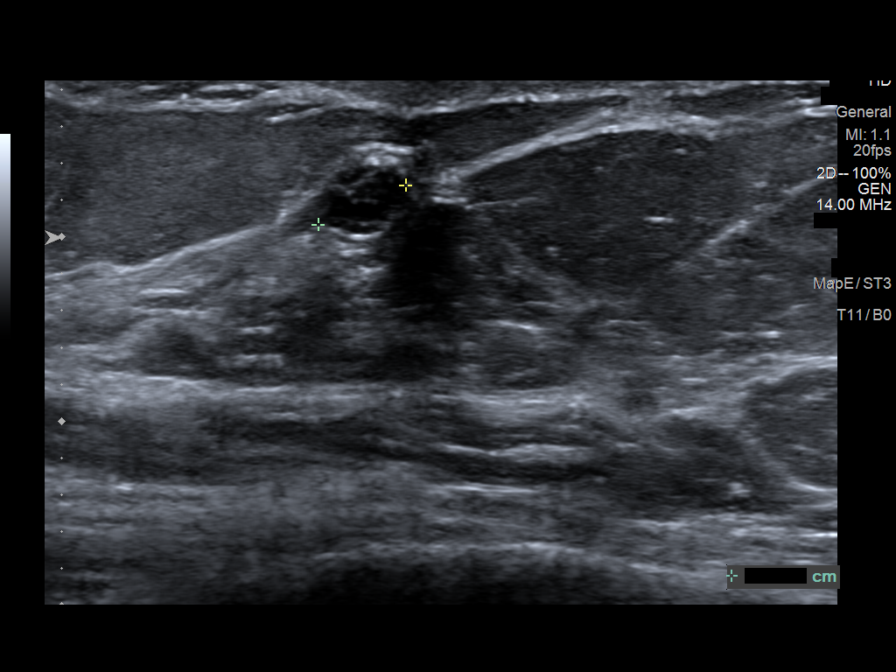
[im 7/9]
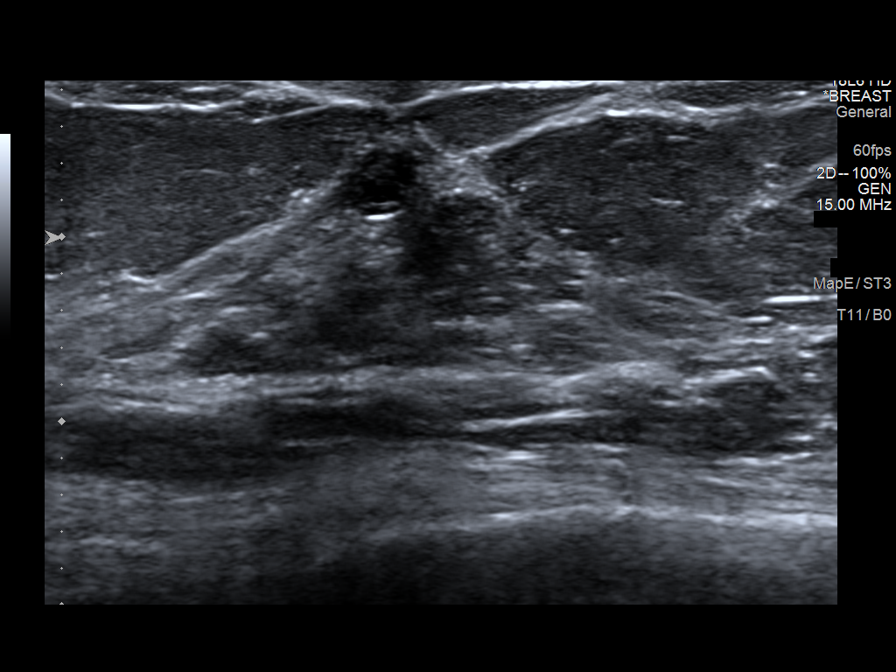
[im 8/9]
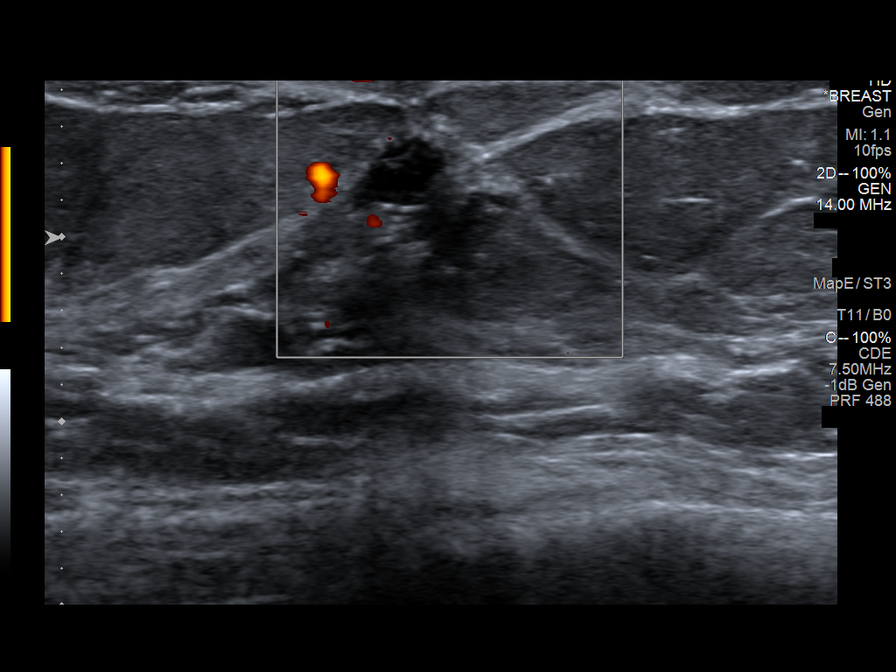
[im 9/9]
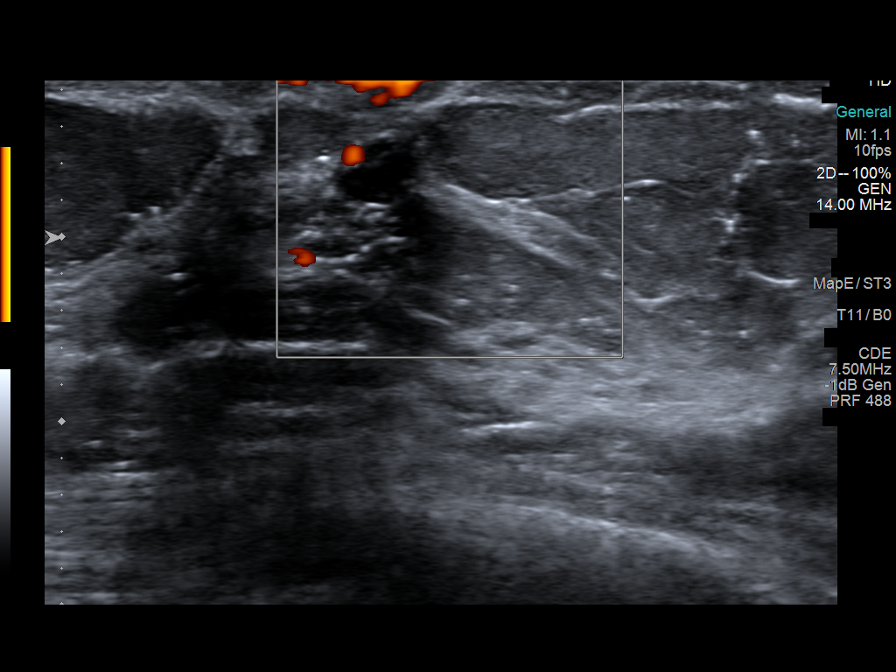

[9 of 9 positions shown; findings below may reference images not displayed]

ACR Breast Density Category b: There are scattered areas of
fibroglandular density.
FINDINGS: Under diagnostic imaging, the possible mass persists. It is oval and
circumscribed in the retroareolar, slightly inferior aspect of the
right breast. It measures 5-6 mm in size.

Mammographic images were processed with CAD.

On physical exam, no mass is palpated in the retroareolar right
breast.

Targeted ultrasound is performed, showing an oval circumscribed hypo
to anechoic mass in the retroareolar right breast at 9 o'clock,
measuring 5 x 4 x 4 mm. There are internal echoes consistent with
septa and some debris, but no convincing internal blood flow. This
is most likely a complicated cyst.
IMPRESSION: 1. Probably benign small right breast mass, most likely a
complicated cyst. Short-term follow-up recommended.

RECOMMENDATION:
1. Diagnostic right breast mammography and ultrasound in 6 months.

I have discussed the findings and recommendations with the patient.
Results were also provided in writing at the conclusion of the
visit. If applicable, a reminder letter will be sent to the patient
regarding the next appointment.

BI-RADS CATEGORY  3: Probably benign.

## 2018-04-12 DIAGNOSIS — R260 Ataxic gait: Secondary | ICD-10-CM | POA: Diagnosis not present

## 2018-05-01 DIAGNOSIS — L820 Inflamed seborrheic keratosis: Secondary | ICD-10-CM | POA: Diagnosis not present

## 2018-08-14 DIAGNOSIS — D225 Melanocytic nevi of trunk: Secondary | ICD-10-CM | POA: Diagnosis not present

## 2018-08-14 DIAGNOSIS — D1801 Hemangioma of skin and subcutaneous tissue: Secondary | ICD-10-CM | POA: Diagnosis not present

## 2018-08-14 DIAGNOSIS — L905 Scar conditions and fibrosis of skin: Secondary | ICD-10-CM | POA: Diagnosis not present

## 2018-08-14 DIAGNOSIS — L570 Actinic keratosis: Secondary | ICD-10-CM | POA: Diagnosis not present

## 2018-08-14 DIAGNOSIS — L821 Other seborrheic keratosis: Secondary | ICD-10-CM | POA: Diagnosis not present

## 2018-08-14 DIAGNOSIS — L814 Other melanin hyperpigmentation: Secondary | ICD-10-CM | POA: Diagnosis not present

## 2018-09-13 ENCOUNTER — Other Ambulatory Visit: Payer: Self-pay

## 2018-09-13 ENCOUNTER — Ambulatory Visit
Admission: RE | Admit: 2018-09-13 | Discharge: 2018-09-13 | Disposition: A | Discharge status: Home / Self Care | Payer: PPO | Source: Ambulatory Visit | Attending: General Surgery | Admitting: General Surgery

## 2018-09-13 ENCOUNTER — Ambulatory Visit: Payer: PPO

## 2018-09-13 DIAGNOSIS — D241 Benign neoplasm of right breast: Secondary | ICD-10-CM

## 2018-09-13 DIAGNOSIS — R928 Other abnormal and inconclusive findings on diagnostic imaging of breast: Secondary | ICD-10-CM | POA: Diagnosis not present

## 2018-11-02 DIAGNOSIS — H2513 Age-related nuclear cataract, bilateral: Secondary | ICD-10-CM | POA: Diagnosis not present

## 2018-11-02 DIAGNOSIS — H25013 Cortical age-related cataract, bilateral: Secondary | ICD-10-CM | POA: Diagnosis not present

## 2018-11-02 DIAGNOSIS — H35033 Hypertensive retinopathy, bilateral: Secondary | ICD-10-CM | POA: Diagnosis not present

## 2018-11-02 DIAGNOSIS — H40013 Open angle with borderline findings, low risk, bilateral: Secondary | ICD-10-CM | POA: Diagnosis not present

## 2018-11-23 DIAGNOSIS — M810 Age-related osteoporosis without current pathological fracture: Secondary | ICD-10-CM | POA: Diagnosis not present

## 2018-11-23 DIAGNOSIS — E7800 Pure hypercholesterolemia, unspecified: Secondary | ICD-10-CM | POA: Diagnosis not present

## 2018-11-23 DIAGNOSIS — Z0000 Encounter for general adult medical examination without abnormal findings: Secondary | ICD-10-CM | POA: Diagnosis not present

## 2018-11-23 DIAGNOSIS — Z131 Encounter for screening for diabetes mellitus: Secondary | ICD-10-CM | POA: Diagnosis not present

## 2018-11-23 DIAGNOSIS — Z23 Encounter for immunization: Secondary | ICD-10-CM | POA: Diagnosis not present

## 2018-11-23 DIAGNOSIS — Z1389 Encounter for screening for other disorder: Secondary | ICD-10-CM | POA: Diagnosis not present

## 2018-11-23 DIAGNOSIS — I10 Essential (primary) hypertension: Secondary | ICD-10-CM | POA: Diagnosis not present

## 2018-11-23 DIAGNOSIS — H35033 Hypertensive retinopathy, bilateral: Secondary | ICD-10-CM | POA: Diagnosis not present

## 2018-11-23 DIAGNOSIS — D369 Benign neoplasm, unspecified site: Secondary | ICD-10-CM | POA: Diagnosis not present

## 2018-12-03 ENCOUNTER — Ambulatory Visit: Payer: PPO | Admitting: Podiatry

## 2018-12-07 ENCOUNTER — Other Ambulatory Visit: Payer: Self-pay

## 2018-12-07 ENCOUNTER — Encounter: Payer: Self-pay | Admitting: Podiatry

## 2018-12-07 ENCOUNTER — Ambulatory Visit: Payer: PPO | Admitting: Podiatry

## 2018-12-07 DIAGNOSIS — M79675 Pain in left toe(s): Secondary | ICD-10-CM | POA: Diagnosis not present

## 2018-12-07 DIAGNOSIS — B351 Tinea unguium: Secondary | ICD-10-CM

## 2018-12-07 DIAGNOSIS — M79674 Pain in right toe(s): Secondary | ICD-10-CM | POA: Diagnosis not present

## 2018-12-07 NOTE — Progress Notes (Signed)
Subjective:   Patient ID: April Dixon, female   DOB: 85 y.o.   MRN: [id number]   HPI Patient presents with elongated incurvated nailbeds 1-5 both feet that are thick and she cannot cut herself   ROS      Objective:  Physical Exam  Neurovascular status intact with thick yellow brittle nailbeds that are painful 1-5 both feet and incurvated in the corners     Assessment:  Mycotic nail infection 1-5 both feet with pain     Plan:  Reviewed condition and debrided nailbeds 1-5 both feet with no iatrogenic bleeding and reappoint for routine care

## 2019-01-22 DIAGNOSIS — E559 Vitamin D deficiency, unspecified: Secondary | ICD-10-CM | POA: Diagnosis not present

## 2019-03-07 ENCOUNTER — Ambulatory Visit: Payer: PPO | Attending: Internal Medicine

## 2019-03-07 DIAGNOSIS — Z23 Encounter for immunization: Secondary | ICD-10-CM | POA: Insufficient documentation

## 2019-03-07 NOTE — Progress Notes (Signed)
   Covid-19 Vaccination Clinic  Name:  [name]    MRN: [id number] DOB: 04/04/1931  03/07/2019  [name] was observed post Covid-19 immunization for 15 minutes without incidence. She was provided with Vaccine Information Sheet and instruction to access the V-Safe system.   [name] was instructed to call 911 with any severe reactions post vaccine: Marland Kitchen Difficulty breathing  . Swelling of your face and throat  . A fast heartbeat  . A bad rash all over your body  . Dizziness and weakness    Immunizations Administered    Name Date Dose VIS Date Route   Pfizer COVID-19 Vaccine 03/07/2019 12:49 PM 0.3 mL 01/25/2019 Intramuscular   Manufacturer: [place]   Lot: [id number]   [place]: [id number]

## 2019-03-15 ENCOUNTER — Encounter: Payer: Self-pay | Admitting: Podiatry

## 2019-03-15 ENCOUNTER — Other Ambulatory Visit: Payer: Self-pay

## 2019-03-15 ENCOUNTER — Ambulatory Visit: Payer: PPO | Admitting: Podiatry

## 2019-03-15 DIAGNOSIS — B351 Tinea unguium: Secondary | ICD-10-CM | POA: Diagnosis not present

## 2019-03-15 DIAGNOSIS — M79675 Pain in left toe(s): Secondary | ICD-10-CM

## 2019-03-15 DIAGNOSIS — M79674 Pain in right toe(s): Secondary | ICD-10-CM | POA: Diagnosis not present

## 2019-03-15 MED ORDER — NONFORMULARY OR COMPOUNDED ITEM: 3 refills | Status: AC

## 2019-03-15 NOTE — Patient Instructions (Addendum)
[employer] [phone]   [email or web address]  Onychomycosis/Fungal Toenails  WHAT IS IT? An infection that lies within the keratin of your nail plate that is caused by a fungus.  WHY ME? Fungal infections affect all ages, sexes, races, and creeds.  There may be many factors that predispose you to a fungal infection such as age, coexisting medical conditions such as diabetes, or an autoimmune disease; stress, medications, fatigue, genetics, etc.  Bottom line: fungus thrives in a warm, moist environment and your shoes offer such a location.  IS IT CONTAGIOUS? Theoretically, yes.  You do not want to share shoes, nail clippers or files with someone who has fungal toenails.  Walking around barefoot in the same room or sleeping in the same bed is unlikely to transfer the organism.  It is important to realize, however, that fungus can spread easily from one nail to the next on the same foot.  HOW DO WE TREAT THIS?  There are several ways to treat this condition.  Treatment may depend on many factors such as age, medications, pregnancy, liver and kidney conditions, etc.  It is best to ask your doctor which options are available to you.  1. No treatment.   Unlike many other medical concerns, you can live with this condition.  However for many people this can be a painful condition and may lead to ingrown toenails or a bacterial infection.  It is recommended that you keep the nails cut short to help reduce the amount of fungal nail. 2. Topical treatment.  These range from herbal remedies to prescription strength nail lacquers.  About 40-50% effective, topicals require twice daily application for approximately 9 to 12 months or until an entirely new nail has grown out.  The most effective topicals are medical grade medications available through physicians offices. 3. Oral antifungal medications.  With an 80-90% cure rate, the most common oral medication requires 3 to 4 months of therapy and  stays in your system for a year as the new nail grows out.  Oral antifungal medications do require blood work to make sure it is a safe drug for you.  A liver function panel will be performed prior to starting the medication and after the first month of treatment.  It is important to have the blood work performed to avoid any harmful side effects.  In general, this medication safe but blood work is required. 4. Laser Therapy.  This treatment is performed by applying a specialized laser to the affected nail plate.  This therapy is noninvasive, fast, and non-painful.  It is not covered by insurance and is therefore, out of pocket.  The results have been very good with a 80-95% cure rate.  [facility] is the only practice in the area to offer this therapy. 5. Permanent Nail Avulsion.  Removing the entire nail so that a new nail will not grow back.

## 2019-03-16 NOTE — Progress Notes (Signed)
Subjective: April Dixon presents today for follow up of painful mycotic nails b/l that are difficult to trim. Pain interferes with ambulation. Aggravating factors include wearing enclosed shoe gear. Pain is relieved with periodic professional debridement.   No Known Allergies   Objective: There were no vitals filed for this visit.  Vascular Examination:  Capillary refill time to digits immediate b/l, palpable DP pulses b/l, palpable PT pulses b/l, pedal hair sparse b/l and skin temperature gradient within normal limits b/l  Dermatological Examination: Pedal skin with normal turgor, texture and tone bilaterally, no open wounds bilaterally, no interdigital macerations bilaterally and toenails 1-5 b/l elongated, dystrophic, thickened, crumbly with subungual debris  Musculoskeletal: Normal muscle strength 5/5 to all lower extremity muscle groups bilaterally, no gross bony deformities bilaterally and no pain crepitus or joint limitation noted with ROM b/l  Neurological: Protective sensation intact 5/5 intact bilaterally with 10g monofilament b/l and vibratory sensation intact b/l  Assessment: 1. Pain due to onychomycosis of toenails of both feet     Plan: -Toenails 1-5 b/l were debrided in length and girth without iatrogenic bleeding. -Discussed topical, laser and oral medication. Patient opted for topical treatment with compounded medication. Rx written for nonformulary compounding topical antifungal: [facility]: Antifungal cream - Terbinafine 3%, Fluconazole 2%, Tea Tree Oil 5%, Urea 10%, Ibuprofen 2% in DMSO Suspension #56ml. Apply to the affected nail(s) at bedtime. -Patient to continue soft, supportive shoe gear daily. -Patient to report any pedal injuries to medical professional immediately. -Patient/POA to call should there be question/concern in the interim.  Return in about 10 weeks (around 05/24/2019) for nail trim.

## 2019-03-28 ENCOUNTER — Ambulatory Visit: Payer: PPO | Attending: Internal Medicine

## 2019-03-28 DIAGNOSIS — Z23 Encounter for immunization: Secondary | ICD-10-CM | POA: Insufficient documentation

## 2019-03-28 NOTE — Progress Notes (Signed)
   Covid-19 Vaccination Clinic  Name:  April Dixon    MRN: [id number] DOB: [birth year]  03/28/2019  [name] was observed post Covid-19 immunization for 15 minutes without incidence. She was provided with Vaccine Information Sheet and instruction to access the V-Safe system.   [name] was instructed to call 911 with any severe reactions post vaccine: Marland Kitchen Difficulty breathing  . Swelling of your face and throat  . A fast heartbeat  . A bad rash all over your body  . Dizziness and weakness    Immunizations Administered    Name Date Dose VIS Date Route   Pfizer COVID-19 Vaccine 03/28/2019 10:46 AM 0.3 mL 01/25/2019 Intramuscular   Manufacturer: [place]   Lot: [id number]   [place]: [id number]

## 2019-05-15 IMAGING — MG MM CLIP PLACEMENT
2 series · 2 of 2 positions shown · non-contrast
Comparison: Previous exam(s).

CLINICAL DATA: Status post ultrasound-guided core biopsy of mass in
the 7 o'clock retroareolar region of the RIGHT breast.

EXAM:
DIAGNOSTIC RIGHT MAMMOGRAM POST ULTRASOUND BIOPSY

[R ML]
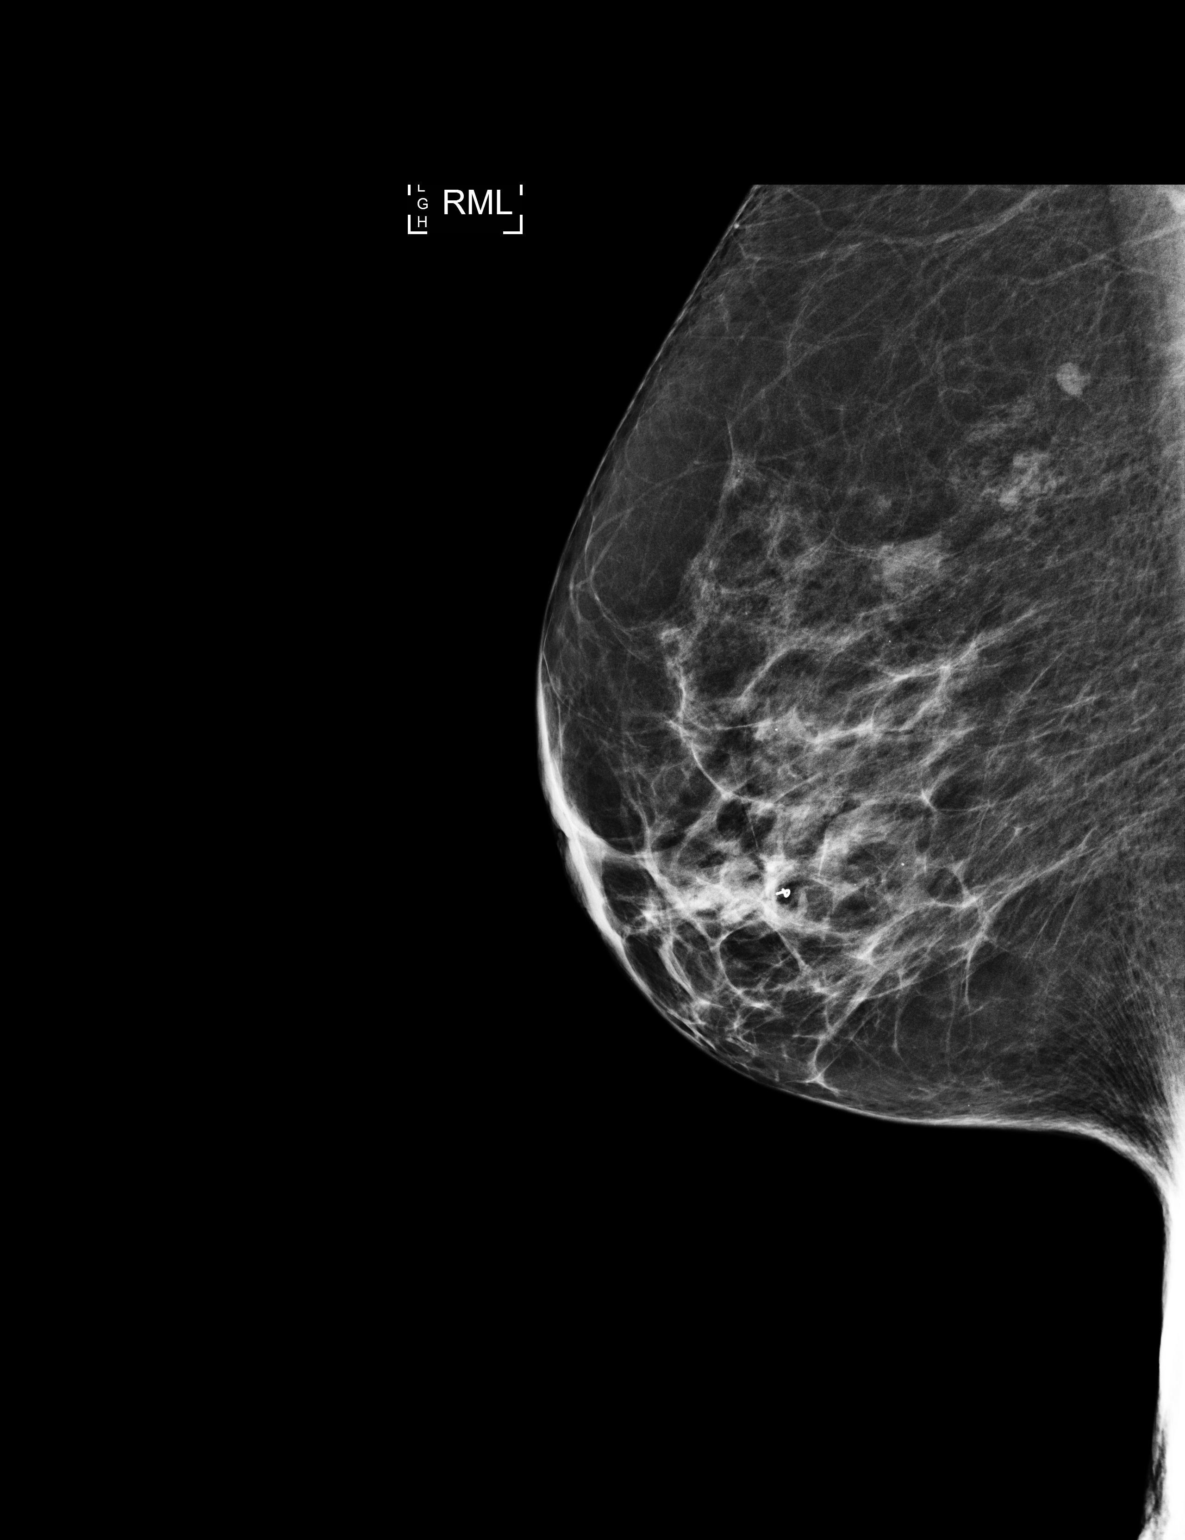

[R CC]
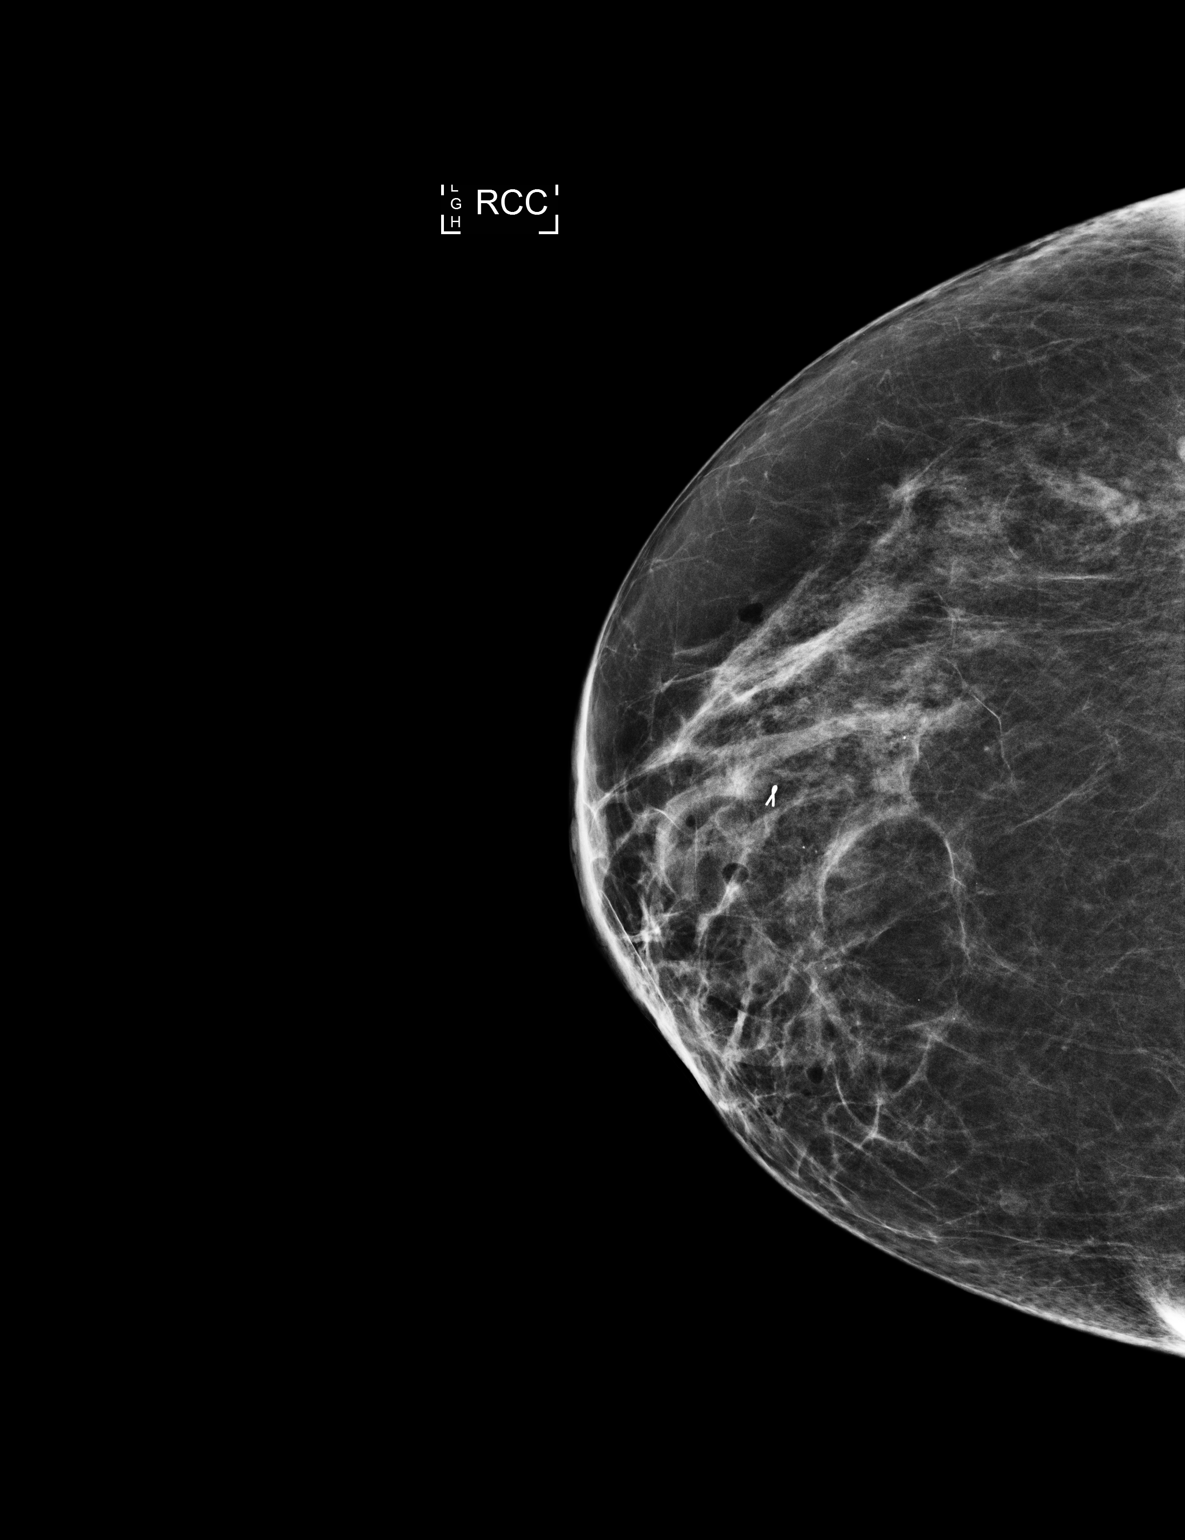

[2 of 2 positions shown; findings below may reference images not displayed]

FINDINGS: Mammographic images were obtained following ultrasound guided biopsy
of mass in the 7 o'clock retroareolar region of the RIGHT breast. A
ribbon shaped clip is identified in the retroareolar LOWER OUTER
QUADRANT of the RIGHT breast.
IMPRESSION: Tissue marker clip in the expected location following biopsy.

Final Assessment: Post Procedure Mammograms for Marker Placement

## 2019-06-13 ENCOUNTER — Other Ambulatory Visit: Payer: Self-pay

## 2019-06-13 ENCOUNTER — Encounter (HOSPITAL_COMMUNITY): Payer: Self-pay

## 2019-06-13 ENCOUNTER — Ambulatory Visit (HOSPITAL_COMMUNITY)
Admission: EM | Admit: 2019-06-13 | Discharge: 2019-06-13 | Disposition: A | Discharge status: Home / Self Care | Payer: PPO | Attending: Family Medicine | Admitting: Family Medicine

## 2019-06-13 DIAGNOSIS — M436 Torticollis: Secondary | ICD-10-CM

## 2019-06-13 MED ORDER — PREDNISONE 20 MG PO TABS: ORAL_TABLET | ORAL | 0 refills | Status: AC

## 2019-06-13 NOTE — Discharge Instructions (Signed)
Gently move the neck side to side with support from towel or brace every 3 hours while awake.  Mild heat will help relax the muscles.

## 2019-06-13 NOTE — ED Triage Notes (Signed)
Pt report she is having right-sided headache, pain behin her right ear and right ear pain x 24 hrs. Pt took 2 aspirin (81 mg) today.

## 2019-06-13 NOTE — ED Provider Notes (Signed)
[place]    CSN: [id number] Arrival date & time: 06/13/19  1402      History   Chief Complaint Chief Complaint  Patient presents with  . Headache  . Arm Pain    HPI April Dixon is a 86 y.o. female.   86 yo established [facility] urgent care patient  Patient presents with stiff neck x 24 hours, with right neck and posterior auricular areas tender.  No fever, trauma or numbness.  Pain radiates to right upper shoulder.  No prior neck problems.  Has taken 2 aspirin with minimal relief.  No chest pain or shortness of breath.     History reviewed. No pertinent past medical history.  There are no problems to display for this patient.   History reviewed. No pertinent surgical history.  OB History   No obstetric history on file.      Home Medications    Prior to Admission medications   Medication Sig Start Date End Date Taking? Authorizing Provider  aspirin EC 81 MG tablet Take 81 mg by mouth daily.   Yes [provider]  co-enzyme Q-10 30 MG capsule Take 30 mg by mouth 3 (three) times daily.   Yes [provider]  magnesium 30 MG tablet Take 30 mg by mouth 2 (two) times daily.   Yes [provider]  Cholecalciferol (VITAMIN D3 PO) Take 1 tablet by mouth daily.    [provider]  Multiple Vitamins-Minerals (MULTIVITAMIN ADULT PO) Take 1 tablet by mouth daily.    [provider]  NONFORMULARY OR COMPOUNDED ITEM Antifungal solution: Terbinafine 3%, Fluconazole 2%, Tea Tree Oil 5%, Urea 10%, Ibuprofen 2% in DMSO suspension #45mL 03/15/19   [place], Stephani Police, DPM  predniSONE (DELTASONE) 20 MG tablet Two daily with food 06/13/19   Robyn Haber, MD  Vitamin D, Ergocalciferol, (DRISDOL) 1.25 MG (50000 UT) CAPS capsule TAKE 1 CAPSULE BY MOUTH ONE TIME PER WEEK 11/27/18   [provider]  cetirizine (ZYRTEC) 5 MG tablet Take 1 tablet (5 mg total) by mouth daily. 05/30/17 06/13/19  Tasia Catchings, Amy V, PA-C  ipratropium  (ATROVENT) 0.06 % nasal spray Place 2 sprays into both nostrils 4 (four) times daily. 05/30/17 06/13/19  Ok Edwards, PA-C    Family History History reviewed. No pertinent family history.  Social History Social History   Tobacco Use  . Smoking status: Unknown If Ever Smoked  . Smokeless tobacco: Never Used  Substance Use Topics  . Alcohol use: Not on file  . Drug use: Not on file     Allergies   Patient has no known allergies.   Review of Systems Review of Systems  Constitutional: Negative.   Musculoskeletal: Positive for neck pain.  All other systems reviewed and are negative.    Physical Exam Triage Vital Signs ED Triage Vitals [06/13/19 1422]  Enc Vitals Group     BP      Pulse      Resp      Temp      Temp src      SpO2      Weight      Height      Head Circumference      Peak Flow      Pain Score 6     Pain Loc      Pain Edu?      Excl. in [place]?    No data found.  Updated Vital Signs BP 132/78 (BP Location: Left  Arm)   Pulse 83   Temp 98.6 F (37 C) (Oral)   Resp 16   SpO2 97%    Physical Exam Vitals and nursing note reviewed.  Constitutional:      General: She is not in acute distress.    Appearance: She is well-developed and normal weight.  HENT:     Head: Normocephalic.  Eyes:     Extraocular Movements: Extraocular movements intact.     Pupils: Pupils are equal, round, and reactive to light.  Cardiovascular:     Rate and Rhythm: Normal rate.  Pulmonary:     Effort: Pulmonary effort is normal.  Musculoskeletal:        General: Normal range of motion.     Cervical back: Normal range of motion and neck supple.  Skin:    General: Skin is warm and dry.  Neurological:     Mental Status: She is alert.     Cranial Nerves: No cranial nerve deficit, dysarthria or facial asymmetry.     Sensory: No sensory deficit.     Motor: No weakness.     Coordination: Romberg sign negative. Coordination normal.  Psychiatric:        Mood and Affect: Mood  normal.        Speech: Speech normal.        Behavior: Behavior normal.      UC Treatments / Results  Labs (all labs ordered are listed, but only abnormal results are displayed) Labs Reviewed - No data to display  EKG   Radiology No results found.  Procedures Procedures (including critical care time)  Medications Ordered in UC Medications - No data to display  Initial Impression / Assessment and Plan / UC Course  I have reviewed the triage vital signs and the nursing notes.  Pertinent labs & imaging results that were available during my care of the patient were reviewed by me and considered in my medical decision making (see chart for details).    Final Clinical Impressions(s) / UC Diagnoses   Final diagnoses:  Torticollis     Discharge Instructions     Gently move the neck side to side with support from towel or brace every 3 hours while awake.  Mild heat will help relax the muscles.    ED Prescriptions    Medication Sig Dispense Auth. Provider   predniSONE (DELTASONE) 20 MG tablet Two daily with food 10 tablet Robyn Haber, MD     I have reviewed the PDMP during this encounter.   Robyn Haber, MD 06/13/19 1444

## 2019-06-14 ENCOUNTER — Ambulatory Visit: Payer: PPO | Admitting: Podiatry

## 2019-06-14 ENCOUNTER — Encounter: Payer: Self-pay | Admitting: Podiatry

## 2019-06-14 VITALS — Temp 96.2°F

## 2019-06-14 DIAGNOSIS — B351 Tinea unguium: Secondary | ICD-10-CM

## 2019-06-14 DIAGNOSIS — M79675 Pain in left toe(s): Secondary | ICD-10-CM

## 2019-06-14 DIAGNOSIS — M79674 Pain in right toe(s): Secondary | ICD-10-CM

## 2019-06-14 NOTE — Patient Instructions (Signed)

## 2019-06-17 NOTE — Progress Notes (Signed)
Subjective: April Dixon is a 86 y.o. female patient seen today painful mycotic nails b/l that are difficult to trim. Pain interferes with ambulation. Aggravating factors include wearing enclosed shoe gear. Pain is relieved with periodic professional debridement  There are no problems to display for this patient.   Current Outpatient Medications on File Prior to Visit  Medication Sig Dispense Refill  . aspirin EC 81 MG tablet Take 81 mg by mouth daily.    . Cholecalciferol (VITAMIN D3 PO) Take 1 tablet by mouth daily.    Marland Kitchen co-enzyme Q-10 30 MG capsule Take 30 mg by mouth 3 (three) times daily.    . magnesium 30 MG tablet Take 30 mg by mouth 2 (two) times daily.    . Multiple Vitamins-Minerals (MULTIVITAMIN ADULT PO) Take 1 tablet by mouth daily.    . NONFORMULARY OR COMPOUNDED ITEM Antifungal solution: Terbinafine 3%, Fluconazole 2%, Tea Tree Oil 5%, Urea 10%, Ibuprofen 2% in DMSO suspension #61mL 1 each 3  . predniSONE (DELTASONE) 20 MG tablet Two daily with food 10 tablet 0  . Vitamin D, Ergocalciferol, (DRISDOL) 1.25 MG (50000 UT) CAPS capsule TAKE 1 CAPSULE BY MOUTH ONE TIME PER WEEK    . [DISCONTINUED] cetirizine (ZYRTEC) 5 MG tablet Take 1 tablet (5 mg total) by mouth daily. 15 tablet 0  . [DISCONTINUED] ipratropium (ATROVENT) 0.06 % nasal spray Place 2 sprays into both nostrils 4 (four) times daily. 15 mL 0   No current facility-administered medications on file prior to visit.    No Known Allergies  Objective: Physical Exam  General: Well developed, nourished, no acute distress, awake, alert and oriented x 3  Vascular:  Dorsalis pedis artery 2/4 bilateral. Posterior tibial artery 2/4 bilateral. Skin temperature gradient warm to warm proximal to distal bilateral lower extremities. No varicosities b/l. Pedal hair remains sparse b/l.  Neurological: Gross sensation present via light touch bilateral. Vibratory sensation intact b/l.  Dermatological: Skin is warm, dry, and supple  bilateral. Nails 1-10 are tender, long, thick, and discolored with subungal debris. No interdigital macerations present bilateral. No open lesions present bilateral. No callus/corns/hyperkeratotic tissue present bilateral. No signs of infection bilateral.  Musculoskeletal: No symptomatic bony deformities noted bilateral. Muscular strength within normal limits without pain on range of motion. No pain with calf compression bilateral.  Assessment and Plan:  Problem List Items Addressed This Visit    None    Visit Diagnoses    Pain due to onychomycosis of toenails of both feet    -  Primary     -Examined patient.  -No new findings. No new orders. -Discussed treatment options for painful mycotic nails. -Mechanically debrided and reduced mycotic nails with sterile nail nipper and dremel nail file without incident. -Patient to continue soft, supportive shoe gear daily. -Patient to report any pedal injuries to medical professional immediately. -Patient/POA to call should there be question/concern in the interim.  Return in about 10 weeks (around 08/23/2019) for nail trim.  Marzetta Board, DPM

## 2019-06-27 DIAGNOSIS — M62838 Other muscle spasm: Secondary | ICD-10-CM | POA: Diagnosis not present

## 2019-08-23 ENCOUNTER — Other Ambulatory Visit: Payer: Self-pay | Admitting: Family Medicine

## 2019-08-23 DIAGNOSIS — D241 Benign neoplasm of right breast: Secondary | ICD-10-CM

## 2019-08-26 ENCOUNTER — Ambulatory Visit: Payer: PPO | Admitting: Podiatry

## 2019-09-12 DIAGNOSIS — D225 Melanocytic nevi of trunk: Secondary | ICD-10-CM | POA: Diagnosis not present

## 2019-09-12 DIAGNOSIS — L720 Epidermal cyst: Secondary | ICD-10-CM | POA: Diagnosis not present

## 2019-09-12 DIAGNOSIS — L814 Other melanin hyperpigmentation: Secondary | ICD-10-CM | POA: Diagnosis not present

## 2019-09-12 DIAGNOSIS — L570 Actinic keratosis: Secondary | ICD-10-CM | POA: Diagnosis not present

## 2019-09-12 DIAGNOSIS — L821 Other seborrheic keratosis: Secondary | ICD-10-CM | POA: Diagnosis not present

## 2019-09-12 DIAGNOSIS — D1801 Hemangioma of skin and subcutaneous tissue: Secondary | ICD-10-CM | POA: Diagnosis not present

## 2019-09-16 ENCOUNTER — Ambulatory Visit
Admission: RE | Admit: 2019-09-16 | Discharge: 2019-09-16 | Disposition: A | Discharge status: Home / Self Care | Payer: PPO | Source: Ambulatory Visit | Attending: Family Medicine | Admitting: Family Medicine

## 2019-09-16 ENCOUNTER — Other Ambulatory Visit: Payer: Self-pay

## 2019-09-16 DIAGNOSIS — R928 Other abnormal and inconclusive findings on diagnostic imaging of breast: Secondary | ICD-10-CM | POA: Diagnosis not present

## 2019-09-16 DIAGNOSIS — D241 Benign neoplasm of right breast: Secondary | ICD-10-CM

## 2019-11-05 DIAGNOSIS — H35033 Hypertensive retinopathy, bilateral: Secondary | ICD-10-CM | POA: Diagnosis not present

## 2019-11-05 DIAGNOSIS — H2513 Age-related nuclear cataract, bilateral: Secondary | ICD-10-CM | POA: Diagnosis not present

## 2019-11-05 DIAGNOSIS — H40013 Open angle with borderline findings, low risk, bilateral: Secondary | ICD-10-CM | POA: Diagnosis not present

## 2019-11-05 DIAGNOSIS — H25013 Cortical age-related cataract, bilateral: Secondary | ICD-10-CM | POA: Diagnosis not present

## 2019-11-26 DIAGNOSIS — D369 Benign neoplasm, unspecified site: Secondary | ICD-10-CM | POA: Diagnosis not present

## 2019-11-26 DIAGNOSIS — E7800 Pure hypercholesterolemia, unspecified: Secondary | ICD-10-CM | POA: Diagnosis not present

## 2019-11-26 DIAGNOSIS — E559 Vitamin D deficiency, unspecified: Secondary | ICD-10-CM | POA: Diagnosis not present

## 2019-11-26 DIAGNOSIS — H35033 Hypertensive retinopathy, bilateral: Secondary | ICD-10-CM | POA: Diagnosis not present

## 2019-11-26 DIAGNOSIS — Z1389 Encounter for screening for other disorder: Secondary | ICD-10-CM | POA: Diagnosis not present

## 2019-11-26 DIAGNOSIS — S81801A Unspecified open wound, right lower leg, initial encounter: Secondary | ICD-10-CM | POA: Diagnosis not present

## 2019-11-26 DIAGNOSIS — I10 Essential (primary) hypertension: Secondary | ICD-10-CM | POA: Diagnosis not present

## 2019-11-26 DIAGNOSIS — Z0000 Encounter for general adult medical examination without abnormal findings: Secondary | ICD-10-CM | POA: Diagnosis not present

## 2019-11-26 DIAGNOSIS — M810 Age-related osteoporosis without current pathological fracture: Secondary | ICD-10-CM | POA: Diagnosis not present

## 2019-11-26 DIAGNOSIS — Z23 Encounter for immunization: Secondary | ICD-10-CM | POA: Diagnosis not present

## 2019-11-26 DIAGNOSIS — Z862 Personal history of diseases of the blood and blood-forming organs and certain disorders involving the immune mechanism: Secondary | ICD-10-CM | POA: Diagnosis not present

## 2019-11-26 DIAGNOSIS — M542 Cervicalgia: Secondary | ICD-10-CM | POA: Diagnosis not present

## 2019-12-02 DIAGNOSIS — M542 Cervicalgia: Secondary | ICD-10-CM | POA: Diagnosis not present

## 2019-12-02 DIAGNOSIS — M25511 Pain in right shoulder: Secondary | ICD-10-CM | POA: Diagnosis not present

## 2019-12-03 ENCOUNTER — Other Ambulatory Visit: Payer: Self-pay | Admitting: Family Medicine

## 2019-12-03 DIAGNOSIS — M8580 Other specified disorders of bone density and structure, unspecified site: Secondary | ICD-10-CM

## 2019-12-09 DIAGNOSIS — M542 Cervicalgia: Secondary | ICD-10-CM | POA: Diagnosis not present

## 2019-12-09 DIAGNOSIS — M25511 Pain in right shoulder: Secondary | ICD-10-CM | POA: Diagnosis not present

## 2019-12-16 DIAGNOSIS — M542 Cervicalgia: Secondary | ICD-10-CM | POA: Diagnosis not present

## 2019-12-16 DIAGNOSIS — M25511 Pain in right shoulder: Secondary | ICD-10-CM | POA: Diagnosis not present

## 2019-12-23 DIAGNOSIS — M25511 Pain in right shoulder: Secondary | ICD-10-CM | POA: Diagnosis not present

## 2019-12-23 DIAGNOSIS — M542 Cervicalgia: Secondary | ICD-10-CM | POA: Diagnosis not present

## 2019-12-30 DIAGNOSIS — M542 Cervicalgia: Secondary | ICD-10-CM | POA: Diagnosis not present

## 2019-12-30 DIAGNOSIS — M25511 Pain in right shoulder: Secondary | ICD-10-CM | POA: Diagnosis not present

## 2020-01-07 DIAGNOSIS — M25511 Pain in right shoulder: Secondary | ICD-10-CM | POA: Diagnosis not present

## 2020-01-07 DIAGNOSIS — M542 Cervicalgia: Secondary | ICD-10-CM | POA: Diagnosis not present

## 2020-01-16 DIAGNOSIS — M542 Cervicalgia: Secondary | ICD-10-CM | POA: Diagnosis not present

## 2020-01-16 DIAGNOSIS — M25511 Pain in right shoulder: Secondary | ICD-10-CM | POA: Diagnosis not present

## 2020-01-27 DIAGNOSIS — M25511 Pain in right shoulder: Secondary | ICD-10-CM | POA: Diagnosis not present

## 2020-01-27 DIAGNOSIS — M542 Cervicalgia: Secondary | ICD-10-CM | POA: Diagnosis not present

## 2020-03-18 ENCOUNTER — Other Ambulatory Visit: Payer: Self-pay | Admitting: Family Medicine

## 2020-03-18 DIAGNOSIS — M810 Age-related osteoporosis without current pathological fracture: Secondary | ICD-10-CM

## 2020-03-20 ENCOUNTER — Ambulatory Visit
Admission: RE | Admit: 2020-03-20 | Discharge: 2020-03-20 | Disposition: A | Discharge status: Home / Self Care | Payer: PPO | Source: Ambulatory Visit | Attending: Family Medicine | Admitting: Family Medicine

## 2020-03-20 ENCOUNTER — Other Ambulatory Visit: Payer: Self-pay

## 2020-03-20 DIAGNOSIS — Z780 Asymptomatic menopausal state: Secondary | ICD-10-CM | POA: Diagnosis not present

## 2020-03-20 DIAGNOSIS — M810 Age-related osteoporosis without current pathological fracture: Secondary | ICD-10-CM

## 2020-03-20 DIAGNOSIS — M8588 Other specified disorders of bone density and structure, other site: Secondary | ICD-10-CM | POA: Diagnosis not present

## 2020-06-08 DIAGNOSIS — E559 Vitamin D deficiency, unspecified: Secondary | ICD-10-CM | POA: Diagnosis not present

## 2020-08-10 ENCOUNTER — Other Ambulatory Visit: Payer: Self-pay | Admitting: Family Medicine

## 2020-08-10 DIAGNOSIS — Z1231 Encounter for screening mammogram for malignant neoplasm of breast: Secondary | ICD-10-CM

## 2020-09-11 DIAGNOSIS — D1801 Hemangioma of skin and subcutaneous tissue: Secondary | ICD-10-CM | POA: Diagnosis not present

## 2020-09-11 DIAGNOSIS — L814 Other melanin hyperpigmentation: Secondary | ICD-10-CM | POA: Diagnosis not present

## 2020-09-11 DIAGNOSIS — L570 Actinic keratosis: Secondary | ICD-10-CM | POA: Diagnosis not present

## 2020-09-11 DIAGNOSIS — L728 Other follicular cysts of the skin and subcutaneous tissue: Secondary | ICD-10-CM | POA: Diagnosis not present

## 2020-09-11 DIAGNOSIS — L821 Other seborrheic keratosis: Secondary | ICD-10-CM | POA: Diagnosis not present

## 2020-09-11 DIAGNOSIS — I788 Other diseases of capillaries: Secondary | ICD-10-CM | POA: Diagnosis not present

## 2020-09-11 DIAGNOSIS — L820 Inflamed seborrheic keratosis: Secondary | ICD-10-CM | POA: Diagnosis not present

## 2020-09-11 DIAGNOSIS — D225 Melanocytic nevi of trunk: Secondary | ICD-10-CM | POA: Diagnosis not present

## 2020-09-15 DIAGNOSIS — H00021 Hordeolum internum right upper eyelid: Secondary | ICD-10-CM | POA: Diagnosis not present

## 2020-10-01 ENCOUNTER — Other Ambulatory Visit: Payer: Self-pay

## 2020-10-01 ENCOUNTER — Ambulatory Visit
Admission: RE | Admit: 2020-10-01 | Discharge: 2020-10-01 | Disposition: A | Discharge status: Home / Self Care | Payer: PPO | Source: Ambulatory Visit | Attending: Family Medicine | Admitting: Family Medicine

## 2020-10-01 DIAGNOSIS — Z1231 Encounter for screening mammogram for malignant neoplasm of breast: Secondary | ICD-10-CM

## 2020-11-05 DIAGNOSIS — H40013 Open angle with borderline findings, low risk, bilateral: Secondary | ICD-10-CM | POA: Diagnosis not present

## 2020-11-05 DIAGNOSIS — H524 Presbyopia: Secondary | ICD-10-CM | POA: Diagnosis not present

## 2020-11-05 DIAGNOSIS — H35033 Hypertensive retinopathy, bilateral: Secondary | ICD-10-CM | POA: Diagnosis not present

## 2020-11-05 DIAGNOSIS — H2513 Age-related nuclear cataract, bilateral: Secondary | ICD-10-CM | POA: Diagnosis not present

## 2020-11-05 DIAGNOSIS — H35363 Drusen (degenerative) of macula, bilateral: Secondary | ICD-10-CM | POA: Diagnosis not present

## 2020-11-24 DIAGNOSIS — W19XXXA Unspecified fall, initial encounter: Secondary | ICD-10-CM | POA: Diagnosis not present

## 2020-11-24 DIAGNOSIS — T07XXXA Unspecified multiple injuries, initial encounter: Secondary | ICD-10-CM | POA: Diagnosis not present

## 2021-01-06 DIAGNOSIS — E559 Vitamin D deficiency, unspecified: Secondary | ICD-10-CM | POA: Diagnosis not present

## 2021-01-06 DIAGNOSIS — H35033 Hypertensive retinopathy, bilateral: Secondary | ICD-10-CM | POA: Diagnosis not present

## 2021-01-06 DIAGNOSIS — Z0000 Encounter for general adult medical examination without abnormal findings: Secondary | ICD-10-CM | POA: Diagnosis not present

## 2021-01-06 DIAGNOSIS — Z23 Encounter for immunization: Secondary | ICD-10-CM | POA: Diagnosis not present

## 2021-01-06 DIAGNOSIS — Z1389 Encounter for screening for other disorder: Secondary | ICD-10-CM | POA: Diagnosis not present

## 2021-01-06 DIAGNOSIS — M810 Age-related osteoporosis without current pathological fracture: Secondary | ICD-10-CM | POA: Diagnosis not present

## 2021-01-06 DIAGNOSIS — Z862 Personal history of diseases of the blood and blood-forming organs and certain disorders involving the immune mechanism: Secondary | ICD-10-CM | POA: Diagnosis not present

## 2021-01-06 DIAGNOSIS — D369 Benign neoplasm, unspecified site: Secondary | ICD-10-CM | POA: Diagnosis not present

## 2021-01-06 DIAGNOSIS — I10 Essential (primary) hypertension: Secondary | ICD-10-CM | POA: Diagnosis not present

## 2021-01-06 DIAGNOSIS — E7800 Pure hypercholesterolemia, unspecified: Secondary | ICD-10-CM | POA: Diagnosis not present

## 2021-03-22 ENCOUNTER — Encounter: Payer: Self-pay | Admitting: Podiatry

## 2021-03-22 ENCOUNTER — Other Ambulatory Visit: Payer: Self-pay

## 2021-03-22 ENCOUNTER — Ambulatory Visit: Payer: PPO | Admitting: Podiatry

## 2021-03-22 DIAGNOSIS — M79675 Pain in left toe(s): Secondary | ICD-10-CM

## 2021-03-22 DIAGNOSIS — M79674 Pain in right toe(s): Secondary | ICD-10-CM | POA: Diagnosis not present

## 2021-03-22 DIAGNOSIS — B351 Tinea unguium: Secondary | ICD-10-CM

## 2021-03-22 NOTE — Progress Notes (Signed)
Subjective: April Dixon is a 88 y.o. female patient seen today painful mycotic nails b/l that are difficult to trim. Pain interferes with ambulation. Aggravating factors include wearing enclosed shoe gear. Pain is relieved with periodic professional debridement  Relates new concern of right ankle and swelling. Relates years ago that she was told she had a soft tissue tumor. Possible a lipoma and relates lately some pain around the ankle.    There are no problems to display for this patient.   Current Outpatient Medications on File Prior to Visit  Medication Sig Dispense Refill   aspirin EC 81 MG tablet Take 81 mg by mouth daily.     Cholecalciferol (VITAMIN D3 PO) Take 1 tablet by mouth daily.     co-enzyme Q-10 30 MG capsule Take 30 mg by mouth 3 (three) times daily.     magnesium 30 MG tablet Take 30 mg by mouth 2 (two) times daily.     Multiple Vitamins-Minerals (MULTIVITAMIN ADULT PO) Take 1 tablet by mouth daily.     NONFORMULARY OR COMPOUNDED ITEM Antifungal solution: Terbinafine 3%, Fluconazole 2%, Tea Tree Oil 5%, Urea 10%, Ibuprofen 2% in DMSO suspension #86mL 1 each 3   predniSONE (DELTASONE) 20 MG tablet Two daily with food 10 tablet 0   Vitamin D, Ergocalciferol, (DRISDOL) 1.25 MG (50000 UT) CAPS capsule TAKE 1 CAPSULE BY MOUTH ONE TIME PER WEEK     [DISCONTINUED] cetirizine (ZYRTEC) 5 MG tablet Take 1 tablet (5 mg total) by mouth daily. 15 tablet 0   [DISCONTINUED] ipratropium (ATROVENT) 0.06 % nasal spray Place 2 sprays into both nostrils 4 (four) times daily. 15 mL 0   No current facility-administered medications on file prior to visit.    No Known Allergies  Objective: Physical Exam  General: Well developed, nourished, no acute distress, awake, alert and oriented x 3  Vascular:  Dorsalis pedis artery 2/4 bilateral. Posterior tibial artery 2/4 bilateral. Skin temperature gradient warm to warm proximal to distal bilateral lower extremities. No varicosities b/l.  Pedal hair remains sparse b/l.  Neurological: Gross sensation present via light touch bilateral. Vibratory sensation intact b/l.  Dermatological: Skin is warm, dry, and supple bilateral. Nails 1-10 are tender, long, thick, and discolored with subungal debris. No interdigital macerations present bilateral. No open lesions present bilateral. No callus/corns/hyperkeratotic tissue present bilateral. No signs of infection bilateral.  Musculoskeletal: No symptomatic bony deformities noted bilateral. Muscular strength within normal limits without pain on range of motion. No pain with calf compression bilateral.  Assessment and Plan:  Problem List Items Addressed This Visit   None Visit Diagnoses     Pain due to onychomycosis of toenails of both feet    -  Primary       -Examined patient.  -No new findings. No new orders. -Discussed treatment options for painful mycotic nails. -Mechanically debrided and reduced mycotic nails with sterile nail nipper and dremel nail file without incident. -Patient to continue soft, supportive shoe gear daily. -Patient to report any pedal injuries to medical professional immediately. -Patient/POA to call should there be question/concern in the interim.  No follow-ups on file.  Lorenda Peck, MD

## 2021-04-16 DIAGNOSIS — E559 Vitamin D deficiency, unspecified: Secondary | ICD-10-CM | POA: Diagnosis not present

## 2021-06-21 ENCOUNTER — Ambulatory Visit: Payer: PPO | Admitting: Podiatry

## 2021-06-30 ENCOUNTER — Ambulatory Visit: Payer: PPO | Admitting: Podiatry

## 2021-06-30 ENCOUNTER — Encounter: Payer: Self-pay | Admitting: Podiatry

## 2021-06-30 DIAGNOSIS — B351 Tinea unguium: Secondary | ICD-10-CM

## 2021-06-30 DIAGNOSIS — M79674 Pain in right toe(s): Secondary | ICD-10-CM | POA: Diagnosis not present

## 2021-06-30 DIAGNOSIS — M79675 Pain in left toe(s): Secondary | ICD-10-CM | POA: Diagnosis not present

## 2021-06-30 NOTE — Progress Notes (Signed)
Subjective: ?April Dixon is a 88 y.o. female patient seen today painful mycotic nails b/l that are difficult to trim. Pain interferes with ambulation. Aggravating factors include wearing enclosed shoe gear. Pain is relieved with periodic professional debridement ? ? ? ?There are no problems to display for this patient. ? ? ?Current Outpatient Medications on File Prior to Visit  ?Medication Sig Dispense Refill  ? aspirin EC 81 MG tablet Take 81 mg by mouth daily.    ? Cholecalciferol (VITAMIN D3 PO) Take 1 tablet by mouth daily.    ? co-enzyme Q-10 30 MG capsule Take 30 mg by mouth 3 (three) times daily.    ? magnesium 30 MG tablet Take 30 mg by mouth 2 (two) times daily.    ? Multiple Vitamins-Minerals (MULTIVITAMIN ADULT PO) Take 1 tablet by mouth daily.    ? NONFORMULARY OR COMPOUNDED ITEM Antifungal solution: Terbinafine 3%, Fluconazole 2%, Tea Tree Oil 5%, Urea 10%, Ibuprofen 2% in DMSO suspension #19m 1 each 3  ? predniSONE (DELTASONE) 20 MG tablet Two daily with food 10 tablet 0  ? Vitamin D, Ergocalciferol, (DRISDOL) 1.25 MG (50000 UT) CAPS capsule TAKE 1 CAPSULE BY MOUTH ONE TIME PER WEEK    ? [DISCONTINUED] cetirizine (ZYRTEC) 5 MG tablet Take 1 tablet (5 mg total) by mouth daily. 15 tablet 0  ? [DISCONTINUED] ipratropium (ATROVENT) 0.06 % nasal spray Place 2 sprays into both nostrils 4 (four) times daily. 15 mL 0  ? ?No current facility-administered medications on file prior to visit.  ? ? ?No Known Allergies ? ?Objective: ?Physical Exam ? ?General: Well developed, nourished, no acute distress, awake, alert and oriented x 3 ? ?Vascular:  ?Dorsalis pedis artery 2/4 bilateral. Posterior tibial artery 2/4 bilateral. Skin temperature gradient warm to warm proximal to distal bilateral lower extremities. No varicosities b/l. Pedal hair remains sparse b/l. ? ?Neurological: Gross sensation present via light touch bilateral. Vibratory sensation intact b/l. ? ?Dermatological: Skin is warm, dry, and supple  bilateral. Nails 1-10 are tender, long, thick, and discolored with subungal debris. No interdigital macerations present bilateral. No open lesions present bilateral. No callus/corns/hyperkeratotic tissue present bilateral. No signs of infection bilateral. ? ?Musculoskeletal: No symptomatic bony deformities noted bilateral. Muscular strength within normal limits without pain on range of motion. No pain with calf compression bilateral. ? ?Assessment and Plan:  ?Problem List Items Addressed This Visit   ?None ?Visit Diagnoses   ? ? Pain due to onychomycosis of toenails of both feet    -  Primary  ? ?  ? ? ?-Examined patient.  ?-No new findings. No new orders. ?-Discussed treatment options for painful mycotic nails. ?-Mechanically debrided and reduced mycotic nails with sterile nail nipper and dremel nail file without incident. ?-Patient to continue soft, supportive shoe gear daily. ?-Patient to report any pedal injuries to medical professional immediately. ?-Patient/POA to call should there be question/concern in the interim. ? ?No follow-ups on file. ? ?RLorenda Peck DPM ?

## 2021-09-20 ENCOUNTER — Other Ambulatory Visit: Payer: Self-pay | Admitting: Family Medicine

## 2021-09-20 DIAGNOSIS — Z1231 Encounter for screening mammogram for malignant neoplasm of breast: Secondary | ICD-10-CM

## 2021-10-04 ENCOUNTER — Ambulatory Visit
Admission: RE | Admit: 2021-10-04 | Discharge: 2021-10-04 | Disposition: A | Discharge status: Home / Self Care | Payer: PPO | Source: Ambulatory Visit | Attending: Family Medicine | Admitting: Family Medicine

## 2021-10-04 DIAGNOSIS — Z1231 Encounter for screening mammogram for malignant neoplasm of breast: Secondary | ICD-10-CM

## 2021-10-05 ENCOUNTER — Ambulatory Visit: Payer: PPO | Admitting: Podiatry

## 2021-10-05 ENCOUNTER — Encounter: Payer: Self-pay | Admitting: Podiatry

## 2021-10-05 DIAGNOSIS — M79675 Pain in left toe(s): Secondary | ICD-10-CM | POA: Diagnosis not present

## 2021-10-05 DIAGNOSIS — Z862 Personal history of diseases of the blood and blood-forming organs and certain disorders involving the immune mechanism: Secondary | ICD-10-CM | POA: Insufficient documentation

## 2021-10-05 DIAGNOSIS — M79674 Pain in right toe(s): Secondary | ICD-10-CM

## 2021-10-05 DIAGNOSIS — B351 Tinea unguium: Secondary | ICD-10-CM | POA: Diagnosis not present

## 2021-10-05 DIAGNOSIS — E7800 Pure hypercholesterolemia, unspecified: Secondary | ICD-10-CM | POA: Insufficient documentation

## 2021-10-05 DIAGNOSIS — D369 Benign neoplasm, unspecified site: Secondary | ICD-10-CM | POA: Insufficient documentation

## 2021-10-05 DIAGNOSIS — M810 Age-related osteoporosis without current pathological fracture: Secondary | ICD-10-CM | POA: Insufficient documentation

## 2021-10-05 DIAGNOSIS — I10 Essential (primary) hypertension: Secondary | ICD-10-CM | POA: Insufficient documentation

## 2021-10-05 DIAGNOSIS — H35033 Hypertensive retinopathy, bilateral: Secondary | ICD-10-CM | POA: Insufficient documentation

## 2021-10-05 DIAGNOSIS — E559 Vitamin D deficiency, unspecified: Secondary | ICD-10-CM | POA: Insufficient documentation

## 2021-10-05 NOTE — Progress Notes (Signed)
Subjective: April Dixon is a 88 y.o. female patient seen today painful mycotic nails b/l that are difficult to trim. Pain interferes with ambulation. Aggravating factors include wearing enclosed shoe gear. Pain is relieved with periodic professional debridement    Patient Active Problem List   Diagnosis Date Noted   History of anemia 10/05/2021   Hypercholesteremia 10/05/2021   Hypertension 10/05/2021   Hypertensive retinopathy of both eyes 10/05/2021   Intraductal papilloma 10/05/2021   Osteoporosis 10/05/2021   Vitamin D deficiency 10/05/2021    Current Outpatient Medications on File Prior to Visit  Medication Sig Dispense Refill   Ascorbic Acid (VITAMIN C) 500 MG CAPS See admin instructions.     aspirin EC 81 MG tablet Take 81 mg by mouth daily.     Cholecalciferol (VITAMIN D3) 25 MCG (1000 UT) CAPS 1 capsule     Coenzyme Q10 (CO Q10 MAXIMUM STRENGTH) 200 MG capsule 1 capsule     Cyanocobalamin (VITAMIN B12) 1000 MCG TBCR 1 tablet     magnesium 30 MG tablet Take 30 mg by mouth 2 (two) times daily.     Multiple Vitamins-Minerals (MULTIVITAMIN ADULT PO) Take 1 tablet by mouth daily.     NONFORMULARY OR COMPOUNDED ITEM Antifungal solution: Terbinafine 3%, Fluconazole 2%, Tea Tree Oil 5%, Urea 10%, Ibuprofen 2% in DMSO suspension #82m 1 each 3   predniSONE (DELTASONE) 20 MG tablet Two daily with food 10 tablet 0   Vitamin D, Ergocalciferol, (DRISDOL) 1.25 MG (50000 UT) CAPS capsule TAKE 1 CAPSULE BY MOUTH ONE TIME PER WEEK     Zinc 50 MG TABS 1 tablet     [DISCONTINUED] cetirizine (ZYRTEC) 5 MG tablet Take 1 tablet (5 mg total) by mouth daily. 15 tablet 0   [DISCONTINUED] ipratropium (ATROVENT) 0.06 % nasal spray Place 2 sprays into both nostrils 4 (four) times daily. 15 mL 0   No current facility-administered medications on file prior to visit.    No Known Allergies  Objective: Physical Exam  General: Well developed, nourished, no acute distress, awake, alert and  oriented x 3  Vascular:  Dorsalis pedis artery 2/4 bilateral. Posterior tibial artery 2/4 bilateral. Skin temperature gradient warm to warm proximal to distal bilateral lower extremities. No varicosities b/l. Pedal hair remains sparse b/l.  Neurological: Gross sensation present via light touch bilateral. Vibratory sensation intact b/l.  Dermatological: Skin is warm, dry, and supple bilateral. Nails 1-10 are tender, long, thick, and discolored with subungal debris. No interdigital macerations present bilateral. No open lesions present bilateral. No callus/corns/hyperkeratotic tissue present bilateral. No signs of infection bilateral.  Musculoskeletal: No symptomatic bony deformities noted bilateral. Muscular strength within normal limits without pain on range of motion. No pain with calf compression bilateral.  Assessment and Plan:  Problem List Items Addressed This Visit   None    -Examined patient.  -No new findings. No new orders. -Discussed treatment options for painful mycotic nails. -Mechanically debrided and reduced mycotic nails with sterile nail nipper and dremel nail file without incident. -Patient to continue soft, supportive shoe gear daily. -Patient to report any pedal injuries to medical professional immediately. -Patient/POA to call should there be question/concern in the interim.  No follow-ups on file.  RLorenda Peck DPM

## 2021-11-05 DIAGNOSIS — H40013 Open angle with borderline findings, low risk, bilateral: Secondary | ICD-10-CM | POA: Diagnosis not present

## 2021-11-05 DIAGNOSIS — H25813 Combined forms of age-related cataract, bilateral: Secondary | ICD-10-CM | POA: Diagnosis not present

## 2021-11-05 DIAGNOSIS — H04123 Dry eye syndrome of bilateral lacrimal glands: Secondary | ICD-10-CM | POA: Diagnosis not present

## 2021-11-05 DIAGNOSIS — H524 Presbyopia: Secondary | ICD-10-CM | POA: Diagnosis not present

## 2021-11-05 DIAGNOSIS — H43813 Vitreous degeneration, bilateral: Secondary | ICD-10-CM | POA: Diagnosis not present

## 2021-12-06 DIAGNOSIS — M810 Age-related osteoporosis without current pathological fracture: Secondary | ICD-10-CM | POA: Diagnosis not present

## 2021-12-06 DIAGNOSIS — E663 Overweight: Secondary | ICD-10-CM | POA: Diagnosis not present

## 2021-12-06 DIAGNOSIS — K5900 Constipation, unspecified: Secondary | ICD-10-CM | POA: Diagnosis not present

## 2021-12-06 DIAGNOSIS — E7800 Pure hypercholesterolemia, unspecified: Secondary | ICD-10-CM | POA: Diagnosis not present

## 2021-12-06 DIAGNOSIS — J449 Chronic obstructive pulmonary disease, unspecified: Secondary | ICD-10-CM | POA: Diagnosis not present

## 2021-12-06 DIAGNOSIS — I10 Essential (primary) hypertension: Secondary | ICD-10-CM | POA: Diagnosis not present

## 2021-12-06 DIAGNOSIS — G8929 Other chronic pain: Secondary | ICD-10-CM | POA: Diagnosis not present

## 2021-12-06 DIAGNOSIS — R32 Unspecified urinary incontinence: Secondary | ICD-10-CM | POA: Diagnosis not present

## 2021-12-31 ENCOUNTER — Encounter: Payer: Self-pay | Admitting: Podiatry

## 2021-12-31 ENCOUNTER — Ambulatory Visit: Payer: PPO | Admitting: Podiatry

## 2021-12-31 DIAGNOSIS — B351 Tinea unguium: Secondary | ICD-10-CM | POA: Diagnosis not present

## 2021-12-31 DIAGNOSIS — M79675 Pain in left toe(s): Secondary | ICD-10-CM

## 2021-12-31 DIAGNOSIS — M79674 Pain in right toe(s): Secondary | ICD-10-CM

## 2021-12-31 NOTE — Progress Notes (Signed)

## 2022-01-27 DIAGNOSIS — Z23 Encounter for immunization: Secondary | ICD-10-CM | POA: Diagnosis not present

## 2022-01-27 DIAGNOSIS — Z862 Personal history of diseases of the blood and blood-forming organs and certain disorders involving the immune mechanism: Secondary | ICD-10-CM | POA: Diagnosis not present

## 2022-01-27 DIAGNOSIS — Z0000 Encounter for general adult medical examination without abnormal findings: Secondary | ICD-10-CM | POA: Diagnosis not present

## 2022-01-27 DIAGNOSIS — E559 Vitamin D deficiency, unspecified: Secondary | ICD-10-CM | POA: Diagnosis not present

## 2022-01-27 DIAGNOSIS — B351 Tinea unguium: Secondary | ICD-10-CM | POA: Diagnosis not present

## 2022-01-27 DIAGNOSIS — D369 Benign neoplasm, unspecified site: Secondary | ICD-10-CM | POA: Diagnosis not present

## 2022-01-27 DIAGNOSIS — I10 Essential (primary) hypertension: Secondary | ICD-10-CM | POA: Diagnosis not present

## 2022-01-27 DIAGNOSIS — E7800 Pure hypercholesterolemia, unspecified: Secondary | ICD-10-CM | POA: Diagnosis not present

## 2022-01-27 DIAGNOSIS — H35033 Hypertensive retinopathy, bilateral: Secondary | ICD-10-CM | POA: Diagnosis not present

## 2022-01-27 DIAGNOSIS — M810 Age-related osteoporosis without current pathological fracture: Secondary | ICD-10-CM | POA: Diagnosis not present

## 2022-01-27 DIAGNOSIS — Z85828 Personal history of other malignant neoplasm of skin: Secondary | ICD-10-CM | POA: Diagnosis not present

## 2022-03-10 DIAGNOSIS — D225 Melanocytic nevi of trunk: Secondary | ICD-10-CM | POA: Diagnosis not present

## 2022-03-10 DIAGNOSIS — L814 Other melanin hyperpigmentation: Secondary | ICD-10-CM | POA: Diagnosis not present

## 2022-03-10 DIAGNOSIS — L570 Actinic keratosis: Secondary | ICD-10-CM | POA: Diagnosis not present

## 2022-03-10 DIAGNOSIS — L578 Other skin changes due to chronic exposure to nonionizing radiation: Secondary | ICD-10-CM | POA: Diagnosis not present

## 2022-03-10 DIAGNOSIS — L821 Other seborrheic keratosis: Secondary | ICD-10-CM | POA: Diagnosis not present

## 2022-04-08 ENCOUNTER — Ambulatory Visit: Payer: PPO | Admitting: Podiatry

## 2022-04-12 ENCOUNTER — Ambulatory Visit: Payer: PPO | Admitting: Podiatry

## 2022-04-12 ENCOUNTER — Encounter: Payer: Self-pay | Admitting: Podiatry

## 2022-04-12 DIAGNOSIS — B351 Tinea unguium: Secondary | ICD-10-CM

## 2022-04-12 DIAGNOSIS — M79675 Pain in left toe(s): Secondary | ICD-10-CM | POA: Diagnosis not present

## 2022-04-12 DIAGNOSIS — M79674 Pain in right toe(s): Secondary | ICD-10-CM

## 2022-04-12 NOTE — Progress Notes (Signed)
Subjective: April Dixon is a 89 y.o. female patient seen today painful mycotic nails b/l that are difficult to trim. Pain interferes with ambulation. Aggravating factors include wearing enclosed shoe gear. Pain is relieved with periodic professional debridement    Patient Active Problem List   Diagnosis Date Noted   History of anemia 10/05/2021   Hypercholesteremia 10/05/2021   Hypertension 10/05/2021   Hypertensive retinopathy of both eyes 10/05/2021   Intraductal papilloma 10/05/2021   Osteoporosis 10/05/2021   Vitamin D deficiency 10/05/2021    Current Outpatient Medications on File Prior to Visit  Medication Sig Dispense Refill   Ascorbic Acid (VITAMIN C) 500 MG CAPS See admin instructions.     aspirin EC 81 MG tablet Take 81 mg by mouth daily.     Cholecalciferol (VITAMIN D3) 25 MCG (1000 UT) CAPS 1 capsule     Coenzyme Q10 (CO Q10 MAXIMUM STRENGTH) 200 MG capsule 1 capsule     Cyanocobalamin (VITAMIN B12) 1000 MCG TBCR 1 tablet     magnesium 30 MG tablet Take 30 mg by mouth 2 (two) times daily.     Multiple Vitamins-Minerals (MULTIVITAMIN ADULT PO) Take 1 tablet by mouth daily.     NONFORMULARY OR COMPOUNDED ITEM Antifungal solution: Terbinafine 3%, Fluconazole 2%, Tea Tree Oil 5%, Urea 10%, Ibuprofen 2% in DMSO suspension #32m 1 each 3   predniSONE (DELTASONE) 20 MG tablet Two daily with food 10 tablet 0   Vitamin D, Ergocalciferol, (DRISDOL) 1.25 MG (50000 UT) CAPS capsule TAKE 1 CAPSULE BY MOUTH ONE TIME PER WEEK     Zinc 50 MG TABS 1 tablet     [DISCONTINUED] cetirizine (ZYRTEC) 5 MG tablet Take 1 tablet (5 mg total) by mouth daily. 15 tablet 0   [DISCONTINUED] ipratropium (ATROVENT) 0.06 % nasal spray Place 2 sprays into both nostrils 4 (four) times daily. 15 mL 0   No current facility-administered medications on file prior to visit.    No Known Allergies  Objective: Physical Exam  General: Well developed, nourished, no acute distress, awake, alert and  oriented x 3  Vascular:  Dorsalis pedis artery 2/4 bilateral. Posterior tibial artery 2/4 bilateral. Skin temperature gradient warm to warm proximal to distal bilateral lower extremities. No varicosities b/l. Pedal hair remains sparse b/l.  Neurological: Gross sensation present via light touch bilateral. Vibratory sensation intact b/l.  Dermatological: Skin is warm, dry, and supple bilateral. Nails 1-10 are tender, long, thick, and discolored with subungal debris. No interdigital macerations present bilateral. No open lesions present bilateral. No callus/corns/hyperkeratotic tissue present bilateral. No signs of infection bilateral.  Musculoskeletal: No symptomatic bony deformities noted bilateral. Muscular strength within normal limits without pain on range of motion. No pain with calf compression bilateral.  Assessment and Plan:  Problem List Items Addressed This Visit   None Visit Diagnoses     Pain due to onychomycosis of toenails of both feet    -  Primary        -Examined patient.  -No new findings. No new orders. -Discussed treatment options for painful mycotic nails. -Mechanically debrided and reduced mycotic nails with sterile nail nipper and dremel nail file without incident. -Patient to continue soft, supportive shoe gear daily. -Patient to report any pedal injuries to medical professional immediately. -Patient/POA to call should there be question/concern in the interim.  No follow-ups on file.  RLorenda Peck DPM

## 2022-07-12 ENCOUNTER — Encounter: Payer: Self-pay | Admitting: Podiatry

## 2022-07-12 ENCOUNTER — Ambulatory Visit: Payer: PPO | Admitting: Podiatry

## 2022-07-12 DIAGNOSIS — M79674 Pain in right toe(s): Secondary | ICD-10-CM | POA: Diagnosis not present

## 2022-07-12 DIAGNOSIS — M79675 Pain in left toe(s): Secondary | ICD-10-CM

## 2022-07-12 DIAGNOSIS — B351 Tinea unguium: Secondary | ICD-10-CM | POA: Diagnosis not present

## 2022-07-12 NOTE — Progress Notes (Signed)
Subjective: April Dixon is a 89 y.o. female patient seen today painful mycotic nails b/l that are difficult to trim. Pain interferes with ambulation. Aggravating factors include wearing enclosed shoe gear. Pain is relieved with periodic professional debridement    Patient Active Problem List   Diagnosis Date Noted   History of anemia 10/05/2021   Hypercholesteremia 10/05/2021   Hypertension 10/05/2021   Hypertensive retinopathy of both eyes 10/05/2021   Intraductal papilloma 10/05/2021   Osteoporosis 10/05/2021   Vitamin D deficiency 10/05/2021    Current Outpatient Medications on File Prior to Visit  Medication Sig Dispense Refill   Ascorbic Acid (VITAMIN C) 500 MG CAPS See admin instructions.     aspirin EC 81 MG tablet Take 81 mg by mouth daily.     Cholecalciferol (VITAMIN D3) 25 MCG (1000 UT) CAPS 1 capsule     Coenzyme Q10 (CO Q10 MAXIMUM STRENGTH) 200 MG capsule 1 capsule     Cyanocobalamin (VITAMIN B12) 1000 MCG TBCR 1 tablet     magnesium 30 MG tablet Take 30 mg by mouth 2 (two) times daily.     Multiple Vitamins-Minerals (MULTIVITAMIN ADULT PO) Take 1 tablet by mouth daily.     NONFORMULARY OR COMPOUNDED ITEM Antifungal solution: Terbinafine 3%, Fluconazole 2%, Tea Tree Oil 5%, Urea 10%, Ibuprofen 2% in DMSO suspension #77mL 1 each 3   predniSONE (DELTASONE) 20 MG tablet Two daily with food 10 tablet 0   Vitamin D, Ergocalciferol, (DRISDOL) 1.25 MG (50000 UT) CAPS capsule TAKE 1 CAPSULE BY MOUTH ONE TIME PER WEEK     Zinc 50 MG TABS 1 tablet     [DISCONTINUED] cetirizine (ZYRTEC) 5 MG tablet Take 1 tablet (5 mg total) by mouth daily. 15 tablet 0   [DISCONTINUED] ipratropium (ATROVENT) 0.06 % nasal spray Place 2 sprays into both nostrils 4 (four) times daily. 15 mL 0   No current facility-administered medications on file prior to visit.    No Known Allergies  Objective: Physical Exam  General: Well developed, nourished, no acute distress, awake, alert and  oriented x 3  Vascular:  Dorsalis pedis artery 2/4 bilateral. Posterior tibial artery 2/4 bilateral. Skin temperature gradient warm to warm proximal to distal bilateral lower extremities. No varicosities b/l. Pedal hair remains sparse b/l.  Neurological: Gross sensation present via light touch bilateral. Vibratory sensation intact b/l.  Dermatological: Skin is warm, dry, and supple bilateral. Nails 1-10 are tender, long, thick, and discolored with subungal debris. No interdigital macerations present bilateral. No open lesions present bilateral. No callus/corns/hyperkeratotic tissue present bilateral. No signs of infection bilateral.  Musculoskeletal: No symptomatic bony deformities noted bilateral. Muscular strength within normal limits without pain on range of motion. No pain with calf compression bilateral.  Assessment and Plan:  Problem List Items Addressed This Visit   None Visit Diagnoses     Pain due to onychomycosis of toenails of both feet    -  Primary         -Examined patient.  -No new findings. No new orders. -Discussed treatment options for painful mycotic nails. -Mechanically debrided and reduced mycotic nails with sterile nail nipper and dremel nail file without incident. -Patient to continue soft, supportive shoe gear daily. -Patient to report any pedal injuries to medical professional immediately. -Patient/POA to call should there be question/concern in the interim.  Return in about 3 months (around 10/12/2022) for rfc.  Louann Sjogren, DPM

## 2022-07-22 ENCOUNTER — Ambulatory Visit
Admission: RE | Admit: 2022-07-22 | Discharge: 2022-07-22 | Disposition: A | Discharge status: Home / Self Care | Payer: PPO | Source: Ambulatory Visit | Attending: Internal Medicine | Admitting: Internal Medicine

## 2022-07-22 ENCOUNTER — Other Ambulatory Visit: Payer: Self-pay | Admitting: Internal Medicine

## 2022-07-22 DIAGNOSIS — M25562 Pain in left knee: Secondary | ICD-10-CM

## 2022-09-14 ENCOUNTER — Other Ambulatory Visit: Payer: Self-pay | Admitting: Internal Medicine

## 2022-09-14 DIAGNOSIS — Z1231 Encounter for screening mammogram for malignant neoplasm of breast: Secondary | ICD-10-CM

## 2022-10-12 ENCOUNTER — Ambulatory Visit
Admission: RE | Admit: 2022-10-12 | Discharge: 2022-10-12 | Disposition: A | Discharge status: Home / Self Care | Payer: PPO | Source: Ambulatory Visit | Attending: Internal Medicine | Admitting: Internal Medicine

## 2022-10-12 DIAGNOSIS — Z1231 Encounter for screening mammogram for malignant neoplasm of breast: Secondary | ICD-10-CM

## 2022-10-19 ENCOUNTER — Ambulatory Visit: Payer: PPO | Admitting: Podiatry

## 2023-01-11 ENCOUNTER — Ambulatory Visit: Payer: PPO | Admitting: Podiatry

## 2023-01-11 ENCOUNTER — Encounter: Payer: Self-pay | Admitting: Podiatry

## 2023-01-11 DIAGNOSIS — M79674 Pain in right toe(s): Secondary | ICD-10-CM

## 2023-01-11 DIAGNOSIS — M79675 Pain in left toe(s): Secondary | ICD-10-CM

## 2023-01-11 DIAGNOSIS — B351 Tinea unguium: Secondary | ICD-10-CM

## 2023-01-11 NOTE — Progress Notes (Signed)
Subjective: April Dixon is a 89 y.o. female patient seen today painful mycotic nails b/l that are difficult to trim. Pain interferes with ambulation. Aggravating factors include wearing enclosed shoe gear. Pain is relieved with periodic professional debridement    Patient Active Problem List   Diagnosis Date Noted   History of anemia 10/05/2021   Hypercholesteremia 10/05/2021   Hypertension 10/05/2021   Hypertensive retinopathy of both eyes 10/05/2021   Intraductal papilloma 10/05/2021   Osteoporosis 10/05/2021   Vitamin D deficiency 10/05/2021    Current Outpatient Medications on File Prior to Visit  Medication Sig Dispense Refill   Ascorbic Acid (VITAMIN C) 500 MG CAPS See admin instructions.     aspirin EC 81 MG tablet Take 81 mg by mouth daily.     Cholecalciferol (VITAMIN D3) 25 MCG (1000 UT) CAPS 1 capsule     Coenzyme Q10 (CO Q10 MAXIMUM STRENGTH) 200 MG capsule 1 capsule     Cyanocobalamin (VITAMIN B12) 1000 MCG TBCR 1 tablet     magnesium 30 MG tablet Take 30 mg by mouth 2 (two) times daily.     Multiple Vitamins-Minerals (MULTIVITAMIN ADULT PO) Take 1 tablet by mouth daily.     NONFORMULARY OR COMPOUNDED ITEM Antifungal solution: Terbinafine 3%, Fluconazole 2%, Tea Tree Oil 5%, Urea 10%, Ibuprofen 2% in DMSO suspension #54mL 1 each 3   predniSONE (DELTASONE) 20 MG tablet Two daily with food 10 tablet 0   Vitamin D, Ergocalciferol, (DRISDOL) 1.25 MG (50000 UT) CAPS capsule TAKE 1 CAPSULE BY MOUTH ONE TIME PER WEEK     Zinc 50 MG TABS 1 tablet     [DISCONTINUED] cetirizine (ZYRTEC) 5 MG tablet Take 1 tablet (5 mg total) by mouth daily. 15 tablet 0   [DISCONTINUED] ipratropium (ATROVENT) 0.06 % nasal spray Place 2 sprays into both nostrils 4 (four) times daily. 15 mL 0   No current facility-administered medications on file prior to visit.    No Known Allergies  Objective: Physical Exam  General: Well developed, nourished, no acute distress, awake, alert and  oriented x 3  Vascular:  Dorsalis pedis artery 2/4 bilateral. Posterior tibial artery 2/4 bilateral. Skin temperature gradient warm to warm proximal to distal bilateral lower extremities. No varicosities b/l. Pedal hair remains sparse b/l.  Neurological: Gross sensation present via light touch bilateral. Vibratory sensation intact b/l.  Dermatological: Skin is warm, dry, and supple bilateral. Nails 1-10 are tender, long, thick, and discolored with subungal debris. No interdigital macerations present bilateral. No open lesions present bilateral. No callus/corns/hyperkeratotic tissue present bilateral. No signs of infection bilateral.  Musculoskeletal: No symptomatic bony deformities noted bilateral. Muscular strength within normal limits without pain on range of motion. No pain with calf compression bilateral.  Assessment and Plan:  Problem List Items Addressed This Visit   None Visit Diagnoses     Pain due to onychomycosis of toenails of both feet    -  Primary         -Examined patient.  -No new findings. No new orders. -Discussed treatment options for painful mycotic nails. -Mechanically debrided and reduced mycotic nails with sterile nail nipper and dremel nail file without incident. -Patient to continue soft, supportive shoe gear daily. -Patient to report any pedal injuries to medical professional immediately. -Patient/POA to call should there be question/concern in the interim.  No follow-ups on file.  Louann Sjogren, DPM

## 2023-01-31 ENCOUNTER — Other Ambulatory Visit: Payer: Self-pay | Admitting: Internal Medicine

## 2023-01-31 DIAGNOSIS — M810 Age-related osteoporosis without current pathological fracture: Secondary | ICD-10-CM

## 2023-04-12 ENCOUNTER — Ambulatory Visit: Payer: PPO | Admitting: Podiatry

## 2023-04-12 ENCOUNTER — Encounter: Payer: Self-pay | Admitting: Podiatry

## 2023-04-12 DIAGNOSIS — B351 Tinea unguium: Secondary | ICD-10-CM | POA: Diagnosis not present

## 2023-04-12 DIAGNOSIS — M79675 Pain in left toe(s): Secondary | ICD-10-CM

## 2023-04-12 DIAGNOSIS — M79674 Pain in right toe(s): Secondary | ICD-10-CM

## 2023-04-12 NOTE — Progress Notes (Signed)
 Subjective: April Dixon is a 89 y.o. female patient seen today painful mycotic nails b/l that are difficult to trim. Pain interferes with ambulation. Aggravating factors include wearing enclosed shoe gear. Pain is relieved with periodic professional debridement    Patient Active Problem List   Diagnosis Date Noted   History of anemia 10/05/2021   Hypercholesteremia 10/05/2021   Hypertension 10/05/2021   Hypertensive retinopathy of both eyes 10/05/2021   Intraductal papilloma 10/05/2021   Osteoporosis 10/05/2021   Vitamin D deficiency 10/05/2021    Current Outpatient Medications on File Prior to Visit  Medication Sig Dispense Refill   Ascorbic Acid (VITAMIN C) 500 MG CAPS See admin instructions.     aspirin EC 81 MG tablet Take 81 mg by mouth daily.     Cholecalciferol (VITAMIN D3) 25 MCG (1000 UT) CAPS 1 capsule     Coenzyme Q10 (CO Q10 MAXIMUM STRENGTH) 200 MG capsule 1 capsule     Cyanocobalamin (VITAMIN B12) 1000 MCG TBCR 1 tablet     magnesium 30 MG tablet Take 30 mg by mouth 2 (two) times daily.     Multiple Vitamins-Minerals (MULTIVITAMIN ADULT PO) Take 1 tablet by mouth daily.     NONFORMULARY OR COMPOUNDED ITEM Antifungal solution: Terbinafine 3%, Fluconazole 2%, Tea Tree Oil 5%, Urea 10%, Ibuprofen 2% in DMSO suspension #54mL 1 each 3   predniSONE (DELTASONE) 20 MG tablet Two daily with food 10 tablet 0   Vitamin D, Ergocalciferol, (DRISDOL) 1.25 MG (50000 UT) CAPS capsule TAKE 1 CAPSULE BY MOUTH ONE TIME PER WEEK     Zinc 50 MG TABS 1 tablet     [DISCONTINUED] cetirizine (ZYRTEC) 5 MG tablet Take 1 tablet (5 mg total) by mouth daily. 15 tablet 0   [DISCONTINUED] ipratropium (ATROVENT) 0.06 % nasal spray Place 2 sprays into both nostrils 4 (four) times daily. 15 mL 0   No current facility-administered medications on file prior to visit.    No Known Allergies  Objective: Physical Exam  General: Well developed, nourished, no acute distress, awake, alert and  oriented x 3  Vascular:  Dorsalis pedis artery 2/4 bilateral. Posterior tibial artery 2/4 bilateral. Skin temperature gradient warm to warm proximal to distal bilateral lower extremities. No varicosities b/l. Pedal hair remains sparse b/l.  Neurological: Gross sensation present via light touch bilateral. Vibratory sensation intact b/l.  Dermatological: Skin is warm, dry, and supple bilateral. Nails 1-10 are tender, long, thick, and discolored with subungal debris. No interdigital macerations present bilateral. No open lesions present bilateral. No callus/corns/hyperkeratotic tissue present bilateral. No signs of infection bilateral.  Musculoskeletal: No symptomatic bony deformities noted bilateral. Muscular strength within normal limits without pain on range of motion. No pain with calf compression bilateral.  Assessment and Plan:  Problem List Items Addressed This Visit   None Visit Diagnoses     Pain due to onychomycosis of toenails of both feet    -  Primary         -Examined patient.  -No new findings. No new orders. -Discussed treatment options for painful mycotic nails. -Mechanically debrided and reduced mycotic nails with sterile nail nipper and dremel nail file without incident. -Patient to continue soft, supportive shoe gear daily. -Patient to report any pedal injuries to medical professional immediately. -Patient/POA to call should there be question/concern in the interim.  No follow-ups on file.  Louann Sjogren, DPM

## 2023-07-08 DIAGNOSIS — H35033 Hypertensive retinopathy, bilateral: Secondary | ICD-10-CM | POA: Diagnosis not present

## 2023-07-08 DIAGNOSIS — I10 Essential (primary) hypertension: Secondary | ICD-10-CM | POA: Diagnosis not present

## 2023-07-12 DIAGNOSIS — M170 Bilateral primary osteoarthritis of knee: Secondary | ICD-10-CM | POA: Diagnosis not present

## 2023-07-12 DIAGNOSIS — R296 Repeated falls: Secondary | ICD-10-CM | POA: Diagnosis not present

## 2023-07-12 DIAGNOSIS — M810 Age-related osteoporosis without current pathological fracture: Secondary | ICD-10-CM | POA: Diagnosis not present

## 2023-07-12 DIAGNOSIS — R0982 Postnasal drip: Secondary | ICD-10-CM | POA: Diagnosis not present

## 2023-07-12 DIAGNOSIS — R2689 Other abnormalities of gait and mobility: Secondary | ICD-10-CM | POA: Diagnosis not present

## 2023-07-15 DIAGNOSIS — H3500 Unspecified background retinopathy: Secondary | ICD-10-CM | POA: Diagnosis not present

## 2023-07-15 DIAGNOSIS — E7800 Pure hypercholesterolemia, unspecified: Secondary | ICD-10-CM | POA: Diagnosis not present

## 2023-07-15 DIAGNOSIS — H9193 Unspecified hearing loss, bilateral: Secondary | ICD-10-CM | POA: Diagnosis not present

## 2023-07-15 DIAGNOSIS — Z79899 Other long term (current) drug therapy: Secondary | ICD-10-CM | POA: Diagnosis not present

## 2023-07-15 DIAGNOSIS — M170 Bilateral primary osteoarthritis of knee: Secondary | ICD-10-CM | POA: Diagnosis not present

## 2023-07-15 DIAGNOSIS — R2689 Other abnormalities of gait and mobility: Secondary | ICD-10-CM | POA: Diagnosis not present

## 2023-07-15 DIAGNOSIS — D241 Benign neoplasm of right breast: Secondary | ICD-10-CM | POA: Diagnosis not present

## 2023-07-15 DIAGNOSIS — M810 Age-related osteoporosis without current pathological fracture: Secondary | ICD-10-CM | POA: Diagnosis not present

## 2023-07-15 DIAGNOSIS — R296 Repeated falls: Secondary | ICD-10-CM | POA: Diagnosis not present

## 2023-07-15 DIAGNOSIS — Z9181 History of falling: Secondary | ICD-10-CM | POA: Diagnosis not present

## 2023-07-15 DIAGNOSIS — H35033 Hypertensive retinopathy, bilateral: Secondary | ICD-10-CM | POA: Diagnosis not present

## 2023-07-15 DIAGNOSIS — Z90710 Acquired absence of both cervix and uterus: Secondary | ICD-10-CM | POA: Diagnosis not present

## 2023-07-15 DIAGNOSIS — Z602 Problems related to living alone: Secondary | ICD-10-CM | POA: Diagnosis not present

## 2023-07-15 DIAGNOSIS — I10 Essential (primary) hypertension: Secondary | ICD-10-CM | POA: Diagnosis not present

## 2023-07-15 DIAGNOSIS — H269 Unspecified cataract: Secondary | ICD-10-CM | POA: Diagnosis not present

## 2023-07-15 DIAGNOSIS — R0982 Postnasal drip: Secondary | ICD-10-CM | POA: Diagnosis not present

## 2023-07-19 ENCOUNTER — Ambulatory Visit: Payer: PPO | Admitting: Podiatry

## 2023-08-06 DIAGNOSIS — I10 Essential (primary) hypertension: Secondary | ICD-10-CM | POA: Diagnosis not present

## 2023-08-06 DIAGNOSIS — H35033 Hypertensive retinopathy, bilateral: Secondary | ICD-10-CM | POA: Diagnosis not present

## 2023-08-14 DIAGNOSIS — I10 Essential (primary) hypertension: Secondary | ICD-10-CM | POA: Diagnosis not present

## 2023-08-14 DIAGNOSIS — H35033 Hypertensive retinopathy, bilateral: Secondary | ICD-10-CM | POA: Diagnosis not present

## 2023-09-04 ENCOUNTER — Other Ambulatory Visit: Payer: Self-pay | Admitting: Internal Medicine

## 2023-09-04 DIAGNOSIS — Z1231 Encounter for screening mammogram for malignant neoplasm of breast: Secondary | ICD-10-CM

## 2023-09-13 DIAGNOSIS — D241 Benign neoplasm of right breast: Secondary | ICD-10-CM | POA: Diagnosis not present

## 2023-09-13 DIAGNOSIS — M170 Bilateral primary osteoarthritis of knee: Secondary | ICD-10-CM | POA: Diagnosis not present

## 2023-09-13 DIAGNOSIS — H3500 Unspecified background retinopathy: Secondary | ICD-10-CM | POA: Diagnosis not present

## 2023-09-13 DIAGNOSIS — Z90710 Acquired absence of both cervix and uterus: Secondary | ICD-10-CM | POA: Diagnosis not present

## 2023-09-13 DIAGNOSIS — R296 Repeated falls: Secondary | ICD-10-CM | POA: Diagnosis not present

## 2023-09-13 DIAGNOSIS — I10 Essential (primary) hypertension: Secondary | ICD-10-CM | POA: Diagnosis not present

## 2023-09-13 DIAGNOSIS — Z79899 Other long term (current) drug therapy: Secondary | ICD-10-CM | POA: Diagnosis not present

## 2023-09-13 DIAGNOSIS — M810 Age-related osteoporosis without current pathological fracture: Secondary | ICD-10-CM | POA: Diagnosis not present

## 2023-09-13 DIAGNOSIS — H269 Unspecified cataract: Secondary | ICD-10-CM | POA: Diagnosis not present

## 2023-09-13 DIAGNOSIS — Z602 Problems related to living alone: Secondary | ICD-10-CM | POA: Diagnosis not present

## 2023-09-13 DIAGNOSIS — H9193 Unspecified hearing loss, bilateral: Secondary | ICD-10-CM | POA: Diagnosis not present

## 2023-09-13 DIAGNOSIS — E7800 Pure hypercholesterolemia, unspecified: Secondary | ICD-10-CM | POA: Diagnosis not present

## 2023-10-02 ENCOUNTER — Other Ambulatory Visit: Payer: PPO

## 2023-10-04 DIAGNOSIS — K5900 Constipation, unspecified: Secondary | ICD-10-CM | POA: Diagnosis not present

## 2023-10-04 DIAGNOSIS — S39012A Strain of muscle, fascia and tendon of lower back, initial encounter: Secondary | ICD-10-CM | POA: Diagnosis not present

## 2023-10-06 DIAGNOSIS — S39012D Strain of muscle, fascia and tendon of lower back, subsequent encounter: Secondary | ICD-10-CM | POA: Diagnosis not present

## 2023-10-06 DIAGNOSIS — K5900 Constipation, unspecified: Secondary | ICD-10-CM | POA: Diagnosis not present

## 2023-10-13 ENCOUNTER — Ambulatory Visit
Admission: RE | Admit: 2023-10-13 | Discharge: 2023-10-13 | Disposition: A | Discharge status: Home / Self Care | Source: Ambulatory Visit | Attending: Internal Medicine | Admitting: Internal Medicine

## 2023-10-13 DIAGNOSIS — Z1231 Encounter for screening mammogram for malignant neoplasm of breast: Secondary | ICD-10-CM

## 2023-11-20 DIAGNOSIS — H25813 Combined forms of age-related cataract, bilateral: Secondary | ICD-10-CM | POA: Diagnosis not present

## 2023-11-20 DIAGNOSIS — H524 Presbyopia: Secondary | ICD-10-CM | POA: Diagnosis not present

## 2023-11-20 DIAGNOSIS — H04123 Dry eye syndrome of bilateral lacrimal glands: Secondary | ICD-10-CM | POA: Diagnosis not present

## 2023-11-20 DIAGNOSIS — H43813 Vitreous degeneration, bilateral: Secondary | ICD-10-CM | POA: Diagnosis not present

## 2023-11-20 DIAGNOSIS — H40013 Open angle with borderline findings, low risk, bilateral: Secondary | ICD-10-CM | POA: Diagnosis not present
